# Patient Record
Sex: Female | Born: 1990 | Race: Black or African American | Hispanic: No | Marital: Single | Smoking: Never smoker
Health system: Southern US, Community
[De-identification: ages and names within clinical notes are randomized; demographics above are authoritative.]

## PROBLEM LIST (undated history)

## (undated) DIAGNOSIS — D573 Sickle-cell trait: Secondary | ICD-10-CM

## (undated) DIAGNOSIS — O24419 Gestational diabetes mellitus in pregnancy, unspecified control: Secondary | ICD-10-CM

## (undated) HISTORY — PX: CHOLECYSTECTOMY: SHX55

## (undated) HISTORY — DX: Gestational diabetes mellitus in pregnancy, unspecified control: O24.419

## (undated) HISTORY — DX: Sickle-cell trait: D57.3

---
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
## 2010-05-13 LAB — COMPREHENSIVE METABOLIC PANEL
ALT: 210 U/L — ABNORMAL HIGH (ref 0–35)
Alkaline Phosphatase: 130 U/L — ABNORMAL HIGH (ref 39–117)
CO2: 27 mEq/L (ref 19–32)
Calcium: 9 mg/dL (ref 8.4–10.5)
Chloride: 105 mEq/L (ref 96–112)
GFR calc non Af Amer: >60 mL/min (ref >60)
Glucose, Bld: 115 mg/dL — ABNORMAL HIGH (ref 70–99)
Potassium: 3.8 mEq/L (ref 3.5–5.1)
Sodium: 139 mEq/L (ref 135–145)
Total Bilirubin: 1.3 mg/dL — ABNORMAL HIGH (ref 0.3–1.2)

## 2010-05-13 LAB — URINALYSIS, ROUTINE W REFLEX MICROSCOPIC
Hgb urine dipstick: NEGATIVE
Protein, ur: NEGATIVE mg/dL
Urobilinogen, UA: 1 mg/dL (ref 0.0–1.0)

## 2010-05-13 LAB — DIFFERENTIAL
Basophils Absolute: 0 10*3/uL (ref 0.0–0.1)
Basophils Relative: 0 % (ref 0–1)
Eosinophils Absolute: 0.1 10*3/uL (ref 0.0–0.7)
Neutrophils Relative %: 65 % (ref 43–77)

## 2010-05-13 LAB — POCT PREGNANCY, URINE: Preg Test, Ur: NEGATIVE

## 2010-05-13 LAB — URINE MICROSCOPIC-ADD ON

## 2010-05-13 LAB — CBC
HCT: 34.9 % — ABNORMAL LOW (ref 36.0–46.0)
Hemoglobin: 11.6 g/dL — ABNORMAL LOW (ref 12.0–15.0)
MCHC: 33.2 g/dL (ref 30.0–36.0)

## 2010-05-13 LAB — LIPASE, BLOOD: Lipase: 33 U/L (ref 11–59)

## 2010-06-03 LAB — CBC
Hemoglobin: 10.5 g/dL — ABNORMAL LOW (ref 12.0–15.0)
Platelets: 279 10*3/uL (ref 150–400)
RBC: 3.67 MIL/uL — ABNORMAL LOW (ref 3.87–5.11)
RDW: 14.6 % (ref 11.5–15.5)
RDW: 14.6 % (ref 11.5–15.5)
WBC: 11.3 10*3/uL — ABNORMAL HIGH (ref 4.0–10.5)

## 2010-06-03 LAB — RPR: RPR Ser Ql: NONREACTIVE

## 2010-06-08 LAB — URINALYSIS, ROUTINE W REFLEX MICROSCOPIC
Glucose, UA: NEGATIVE mg/dL
Hgb urine dipstick: NEGATIVE
Specific Gravity, Urine: 1.017 (ref 1.005–1.030)
Urobilinogen, UA: 1 mg/dL (ref 0.0–1.0)

## 2010-06-08 LAB — CBC
MCHC: 34.1 g/dL (ref 30.0–36.0)
MCV: 81 fL (ref 78.0–100.0)
Platelets: 326 10*3/uL (ref 150–400)
RBC: 3.88 MIL/uL (ref 3.87–5.11)

## 2010-06-08 LAB — COMPREHENSIVE METABOLIC PANEL
Alkaline Phosphatase: 78 U/L (ref 39–117)
BUN: 4 mg/dL — ABNORMAL LOW (ref 6–23)
Chloride: 107 mEq/L (ref 96–112)
GFR calc Af Amer: >60 mL/min (ref >60)
Glucose, Bld: 89 mg/dL (ref 70–99)
Sodium: 138 mEq/L (ref 135–145)
Total Bilirubin: 0.4 mg/dL (ref 0.3–1.2)

## 2010-06-08 LAB — URINE MICROSCOPIC-ADD ON

## 2010-06-08 LAB — DIFFERENTIAL
Basophils Absolute: 0 10*3/uL (ref 0.0–0.1)
Basophils Relative: 0 % (ref 0–1)
Neutro Abs: 10 10*3/uL — ABNORMAL HIGH (ref 1.7–7.7)
Neutrophils Relative %: 79 % — ABNORMAL HIGH (ref 43–77)

## 2010-06-09 LAB — DIFFERENTIAL
Eosinophils Absolute: 0.1 10*3/uL (ref 0.0–0.7)
Eosinophils Relative: 1 % (ref 0–5)
Lymphs Abs: 2.1 10*3/uL (ref 0.7–4.0)

## 2010-06-09 LAB — COMPREHENSIVE METABOLIC PANEL
ALT: 18 U/L (ref 0–35)
AST: 16 U/L (ref 0–37)
CO2: 23 mEq/L (ref 19–32)
Calcium: 9.6 mg/dL (ref 8.4–10.5)
Chloride: 106 mEq/L (ref 96–112)
GFR calc Af Amer: >60 mL/min (ref >60)
GFR calc non Af Amer: >60 mL/min (ref >60)
Sodium: 136 mEq/L (ref 135–145)
Total Bilirubin: 0.2 mg/dL — ABNORMAL LOW (ref 0.3–1.2)

## 2010-06-09 LAB — CBC
RBC: 3.93 MIL/uL (ref 3.87–5.11)
WBC: 11.1 10*3/uL — ABNORMAL HIGH (ref 4.0–10.5)

## 2010-06-09 LAB — POCT PREGNANCY, URINE: Preg Test, Ur: POSITIVE

## 2010-06-09 LAB — HCG, QUANTITATIVE, PREGNANCY: hCG, Beta Chain, Quant, S: 16981 m[IU]/mL — ABNORMAL HIGH (ref <5)

## 2010-06-09 LAB — WET PREP, GENITAL: Trich, Wet Prep: NONE SEEN

## 2010-06-09 LAB — RPR: RPR Ser Ql: NONREACTIVE

## 2010-06-09 LAB — URINALYSIS, ROUTINE W REFLEX MICROSCOPIC
Ketones, ur: NEGATIVE mg/dL
Nitrite: NEGATIVE
Protein, ur: NEGATIVE mg/dL

## 2010-12-10 LAB — STREP A DNA PROBE: Group A Strep Probe: NEGATIVE

## 2013-10-01 ENCOUNTER — Encounter: Payer: Self-pay | Admitting: Women's Health

## 2013-10-01 ENCOUNTER — Encounter: Payer: Self-pay | Admitting: *Deleted

## 2013-10-01 ENCOUNTER — Encounter: Payer: Self-pay | Admitting: Obstetrics & Gynecology

## 2014-10-14 ENCOUNTER — Other Ambulatory Visit: Payer: Self-pay | Admitting: Obstetrics & Gynecology

## 2014-10-14 DIAGNOSIS — O3680X0 Pregnancy with inconclusive fetal viability, not applicable or unspecified: Secondary | ICD-10-CM

## 2014-10-20 ENCOUNTER — Other Ambulatory Visit: Payer: Self-pay

## 2014-10-24 ENCOUNTER — Ambulatory Visit (INDEPENDENT_AMBULATORY_CARE_PROVIDER_SITE_OTHER): Payer: Medicaid Other

## 2014-10-24 DIAGNOSIS — O3680X0 Pregnancy with inconclusive fetal viability, not applicable or unspecified: Secondary | ICD-10-CM | POA: Diagnosis not present

## 2014-10-24 NOTE — Progress Notes (Signed)
[place] 7+6wks,single IUP w/ys,pos fht 165bpm,crl 15.66mm,EDD 06/06/2015

## 2014-10-28 ENCOUNTER — Encounter: Payer: Medicaid Other | Admitting: Adult Health

## 2014-11-19 ENCOUNTER — Encounter: Payer: Medicaid Other | Admitting: Women's Health

## 2014-12-11 ENCOUNTER — Encounter: Payer: Self-pay | Admitting: Advanced Practice Midwife

## 2014-12-11 ENCOUNTER — Ambulatory Visit (INDEPENDENT_AMBULATORY_CARE_PROVIDER_SITE_OTHER): Payer: Medicaid Other | Admitting: Advanced Practice Midwife

## 2014-12-11 VITALS — BP 120/76 | HR 80 | Ht 64.0 in | Wt 207.0 lb

## 2014-12-11 DIAGNOSIS — Z3492 Encounter for supervision of normal pregnancy, unspecified, second trimester: Secondary | ICD-10-CM | POA: Diagnosis not present

## 2014-12-11 DIAGNOSIS — Z1389 Encounter for screening for other disorder: Secondary | ICD-10-CM

## 2014-12-11 DIAGNOSIS — Z369 Encounter for antenatal screening, unspecified: Secondary | ICD-10-CM

## 2014-12-11 DIAGNOSIS — Z23 Encounter for immunization: Secondary | ICD-10-CM

## 2014-12-11 DIAGNOSIS — Z331 Pregnant state, incidental: Secondary | ICD-10-CM

## 2014-12-11 DIAGNOSIS — Z3490 Encounter for supervision of normal pregnancy, unspecified, unspecified trimester: Secondary | ICD-10-CM | POA: Insufficient documentation

## 2014-12-11 DIAGNOSIS — Z363 Encounter for antenatal screening for malformations: Secondary | ICD-10-CM

## 2014-12-11 LAB — POCT URINALYSIS DIPSTICK
Blood, UA: NEGATIVE
Glucose, UA: NEGATIVE
KETONES UA: NEGATIVE
Leukocytes, UA: NEGATIVE
Nitrite, UA: NEGATIVE
PROTEIN UA: NEGATIVE

## 2014-12-11 NOTE — Patient Instructions (Signed)
Safe Medications in Pregnancy   Acne: Benzoyl Peroxide Salicylic Acid  Backache/Headache: Tylenol: 2 regular strength every 4 hours OR              2 Extra strength every 6 hours  Colds/Coughs/Allergies: Benadryl (alcohol free) 25 mg every 6 hours as needed Breath right strips Claritin Cepacol throat lozenges Chloraseptic throat spray Cold-Eeze- up to three times per day Cough drops, alcohol free Flonase (by prescription only) Guaifenesin Mucinex Robitussin DM (plain only, alcohol free) Saline nasal spray/drops Sudafed (pseudoephedrine) & Actifed ** use only after [redacted] weeks gestation and if you do not have high blood pressure Tylenol Vicks Vaporub Zinc lozenges Zyrtec   Constipation: Colace Ducolax suppositories Fleet enema Glycerin suppositories Metamucil Milk of magnesia Miralax Senokot Smooth move tea  Diarrhea: Kaopectate Imodium A-D  *NO pepto Bismol  Hemorrhoids: Anusol Anusol HC Preparation H Tucks  Indigestion: Tums Maalox Mylanta Zantac  Pepcid  Insomnia: Benadryl (alcohol free) 25mg every 6 hours as needed Tylenol PM Unisom, no Gelcaps  Leg Cramps: Tums MagGel  Nausea/Vomiting:  Bonine Dramamine Emetrol Ginger extract Sea bands Meclizine  Nausea medication to take during pregnancy:  Unisom (doxylamine succinate 25 mg tablets) Take one tablet daily at bedtime. If symptoms are not adequately controlled, the dose can be increased to a maximum recommended dose of two tablets daily (1/2 tablet in the morning, 1/2 tablet mid-afternoon and one at bedtime). Vitamin B6 100mg tablets. Take one tablet twice a day (up to 200 mg per day).  Skin Rashes: Aveeno products Benadryl cream or 25mg every 6 hours as needed Calamine Lotion 1% cortisone cream  Yeast infection: Gyne-lotrimin 7 Monistat 7   **If taking multiple medications, please check labels to avoid duplicating the same active ingredients **take medication as directed on  the label ** Do not exceed 4000 mg of tylenol in 24 hours **Do not take medications that contain aspirin or ibuprofen       Second Trimester of Pregnancy The second trimester is from week 13 through week 28, months 4 through 6. The second trimester is often a time when you feel your best. Your body has also adjusted to being pregnant, and you begin to feel better physically. Usually, morning sickness has lessened or quit completely, you may have more energy, and you may have an increase in appetite. The second trimester is also a time when the fetus is growing rapidly. At the end of the sixth month, the fetus is about 9 inches long and weighs about 1 pounds. You will likely begin to feel the baby move (quickening) between 18 and 20 weeks of the pregnancy. BODY CHANGES Your body goes through many changes during pregnancy. The changes vary from woman to woman.   Your weight will continue to increase. You will notice your lower abdomen bulging out.  You may begin to get stretch marks on your hips, abdomen, and breasts.  You may develop headaches that can be relieved by medicines approved by your health care provider.  You may urinate more often because the fetus is pressing on your bladder.  You may develop or continue to have heartburn as a result of your pregnancy.  You may develop constipation because certain hormones are causing the muscles that push waste through your intestines to slow down.  You may develop hemorrhoids or swollen, bulging veins (varicose veins).  You may have back pain because of the weight gain and pregnancy hormones relaxing your joints between the bones in your pelvis and as   a result of a shift in weight and the muscles that support your balance.  Your breasts will continue to grow and be tender.  Your gums may bleed and may be sensitive to brushing and flossing.  Dark spots or blotches (chloasma, mask of pregnancy) may develop on your face. This will likely  fade after the baby is born.  A dark line from your belly button to the pubic area (linea nigra) may appear. This will likely fade after the baby is born.  You may have changes in your hair. These can include thickening of your hair, rapid growth, and changes in texture. Some women also have hair loss during or after pregnancy, or hair that feels dry or thin. Your hair will most likely return to normal after your baby is born. WHAT TO EXPECT AT YOUR PRENATAL VISITS During a routine prenatal visit:  You will be weighed to make sure you and the fetus are growing normally.  Your blood pressure will be taken.  Your abdomen will be measured to track your baby's growth.  The fetal heartbeat will be listened to.  Any test results from the previous visit will be discussed. Your health care provider may ask you:  How you are feeling.  If you are feeling the baby move.  If you have had any abnormal symptoms, such as leaking fluid, bleeding, severe headaches, or abdominal cramping.  If you are using any tobacco products, including cigarettes, chewing tobacco, and electronic cigarettes.  If you have any questions. Other tests that may be performed during your second trimester include:  Blood tests that check for:  Low iron levels (anemia).  Gestational diabetes (between 24 and 28 weeks).  Rh antibodies.  Urine tests to check for infections, diabetes, or protein in the urine.  An ultrasound to confirm the proper growth and development of the baby.  An amniocentesis to check for possible genetic problems.  Fetal screens for spina bifida and Down syndrome.  HIV (human immunodeficiency virus) testing. Routine prenatal testing includes screening for HIV, unless you choose not to have this test. HOME CARE INSTRUCTIONS   Avoid all smoking, herbs, alcohol, and unprescribed drugs. These chemicals affect the formation and growth of the baby.  Do not use any tobacco products, including  cigarettes, chewing tobacco, and electronic cigarettes. If you need help quitting, ask your health care provider. You may receive counseling support and other resources to help you quit.  Follow your health care provider's instructions regarding medicine use. There are medicines that are either safe or unsafe to take during pregnancy.  Exercise only as directed by your health care provider. Experiencing uterine cramps is a good sign to stop exercising.  Continue to eat regular, healthy meals.  Wear a good support bra for breast tenderness.  Do not use hot tubs, steam rooms, or saunas.  Wear your seat belt at all times when driving.  Avoid raw meat, uncooked cheese, cat litter boxes, and soil used by cats. These carry germs that can cause birth defects in the baby.  Take your prenatal vitamins.  Take 1500-2000 mg of calcium daily starting at the 20th week of pregnancy until you deliver your baby.  Try taking a stool softener (if your health care provider approves) if you develop constipation. Eat more high-fiber foods, such as fresh vegetables or fruit and whole grains. Drink plenty of fluids to keep your urine clear or pale yellow.  Take warm sitz baths to soothe any pain or discomfort caused by   hemorrhoids. Use hemorrhoid cream if your health care provider approves.  If you develop varicose veins, wear support hose. Elevate your feet for 15 minutes, 3-4 times a day. Limit salt in your diet.  Avoid heavy lifting, wear low heel shoes, and practice good posture.  Rest with your legs elevated if you have leg cramps or low back pain.  Visit your dentist if you have not gone yet during your pregnancy. Use a soft toothbrush to brush your teeth and be gentle when you floss.  A sexual relationship may be continued unless your health care provider directs you otherwise.  Continue to go to all your prenatal visits as directed by your health care provider. SEEK MEDICAL CARE IF:   You have  dizziness.  You have mild pelvic cramps, pelvic pressure, or nagging pain in the abdominal area.  You have persistent nausea, vomiting, or diarrhea.  You have a bad smelling vaginal discharge.  You have pain with urination. SEEK IMMEDIATE MEDICAL CARE IF:   You have a fever.  You are leaking fluid from your vagina.  You have spotting or bleeding from your vagina.  You have severe abdominal cramping or pain.  You have rapid weight gain or loss.  You have shortness of breath with chest pain.  You notice sudden or extreme swelling of your face, hands, ankles, feet, or legs.  You have not felt your baby move in over an hour.  You have severe headaches that do not go away with medicine.  You have vision changes.   This information is not intended to replace advice given to you by your health care provider. Make sure you discuss any questions you have with your health care provider.   Document Released: 02/08/2001 Document Revised: 03/07/2014 Document Reviewed: 04/17/2012 Elsevier Interactive Patient Education 2016 [employer]  

## 2014-12-11 NOTE — Progress Notes (Signed)
  Subjective:    Sandra Olson is a [id number] 5817w5d being seen today for her first obstetrical visit.  Her obstetrical history is significant for GHTN (no preeclampsia) during labor with last pregnancy.  Pregnancy history fully reviewed.  Patient reports no complaints.  Filed Vitals:   12/11/14 0909 12/11/14 0918  BP: 120/76   Pulse: 80   Height:  5\' 4"  (1.626 m)  Weight: 207 lb (93.895 kg)     HISTORY: OB History  Gravida Para Term Preterm AB SAB TAB Ectopic Multiple Living  3 2 2       2     # Outcome Date GA Lbr Len/2nd Weight Sex Delivery Anes PTL Lv  3 Current           2 Term 05/13/13 939w0d  5 lb 4 oz (2.381 kg) F Vag-Spont   Y  1 Term 12/13/08 7529w0d   M Vag-Spont   Y     History reviewed. No pertinent past medical history. Past Surgical History  Procedure Laterality Date  . Cholecystectomy     Family History  Problem Relation Age of Onset  . Hypertension Mother   . Hypertension Father   . Diabetes Father   . Sickle cell trait Daughter   . Sickle cell trait Son      Exam                                      System:     Skin: normal coloration and turgor, no rashes    Neurologic: oriented, normal, normal mood   Extremities: normal strength, tone, and muscle mass   HEENT PERRLA   Mouth/Teeth mucous membranes moist, normal dentition   Neck supple and no masses   Cardiovascular: regular rate and rhythm   Respiratory:  appears well, vitals normal, no respiratory distress, acyanotic   Abdomen: soft, non-tender;  FHR: 150          Assessment:    Pregnancy: [id number] Patient Active Problem List   Diagnosis Date Noted  . Supervision of normal pregnancy 12/11/2014        Plan:     Initial labs drawn. Continue prenatal vitamins  Problem list reviewed and updated  Reviewed n/v relief measures and warning s/s to report  Reviewed recommended weight gain based on pre-gravid BMI  Encouraged well-balanced diet Genetic Screening discussed Quad  Screen: requested.  Ultrasound discussed; fetal survey: requested.  Follow up in 4.5-5 weeks for LROB/anatomy scan.  CRESENZO-DISHMAN,Marwah Disbro 12/11/2014

## 2014-12-11 NOTE — Progress Notes (Signed)
Pt states that she had a little spotting, pt is concerned because she has not felt the baby move and it took her a little while to get in for an appointment. Pt given CCNC form and lab consents to read over and sign.

## 2014-12-12 LAB — GC/CHLAMYDIA PROBE AMP
CHLAMYDIA, DNA PROBE: NEGATIVE
Neisseria gonorrhoeae by PCR: NEGATIVE

## 2014-12-13 LAB — URINE CULTURE: Organism ID, Bacteria: NO GROWTH

## 2014-12-18 LAB — CBC
Hematocrit: 36.5 % (ref 34.0–46.6)
Hemoglobin: 12 g/dL (ref 11.1–15.9)
MCH: 25.6 pg — AB (ref 26.6–33.0)
MCHC: 32.9 g/dL (ref 31.5–35.7)
MCV: 78 fL — ABNORMAL LOW (ref 79–97)
Platelets: 340 10*3/uL (ref 150–379)
RBC: 4.69 x10E6/uL (ref 3.77–5.28)
RDW: 15 % (ref 12.3–15.4)
WBC: 10.9 10*3/uL — ABNORMAL HIGH (ref 3.4–10.8)

## 2014-12-18 LAB — RUBELLA SCREEN: RUBELLA: 3.84 {index} (ref >0.99)

## 2014-12-18 LAB — URINALYSIS, ROUTINE W REFLEX MICROSCOPIC
Bilirubin, UA: NEGATIVE
Glucose, UA: NEGATIVE
KETONES UA: NEGATIVE
NITRITE UA: NEGATIVE
Protein, UA: NEGATIVE
RBC UA: NEGATIVE
SPEC GRAV UA: 1.017 (ref 1.005–1.030)
UUROB: 0.2 mg/dL (ref 0.2–1.0)
pH, UA: 6 (ref 5.0–7.5)

## 2014-12-18 LAB — VARICELLA ZOSTER ANTIBODY, IGG: VARICELLA: 1992 {index} (ref >165)

## 2014-12-18 LAB — ABO/RH: RH TYPE: POSITIVE

## 2014-12-18 LAB — CYSTIC FIBROSIS MUTATION 97: GENE DIS ANAL CARRIER INTERP BLD/T-IMP: NOT DETECTED

## 2014-12-18 LAB — RPR: RPR: NONREACTIVE

## 2014-12-18 LAB — PMP SCREEN PROFILE (10S), URINE
Amphetamine Screen, Ur: NEGATIVE ng/mL
BARBITURATE SCRN UR: NEGATIVE ng/mL
BENZODIAZEPINE SCREEN, URINE: NEGATIVE ng/mL
CANNABINOIDS UR QL SCN: NEGATIVE ng/mL
CREATININE(CRT), U: 144 mg/dL (ref 20.0–300.0)
Cocaine(Metab.)Screen, Urine: NEGATIVE ng/mL
Methadone Scn, Ur: NEGATIVE ng/mL
OPIATE SCRN UR: NEGATIVE ng/mL
Oxycodone+Oxymorphone Ur Ql Scn: NEGATIVE ng/mL
PCP Scrn, Ur: NEGATIVE ng/mL
PROPOXYPHENE SCREEN: NEGATIVE ng/mL
Ph of Urine: 5.5 (ref 4.5–8.9)

## 2014-12-18 LAB — MICROSCOPIC EXAMINATION: Casts: NONE SEEN /lpf

## 2014-12-18 LAB — ANTIBODY SCREEN: ANTIBODY SCREEN: NEGATIVE

## 2014-12-18 LAB — HEPATITIS B SURFACE ANTIGEN: HEP B S AG: NEGATIVE

## 2014-12-18 LAB — HIV ANTIBODY (ROUTINE TESTING W REFLEX): HIV SCREEN 4TH GENERATION: NONREACTIVE

## 2015-01-08 ENCOUNTER — Encounter: Payer: Medicaid Other | Admitting: Advanced Practice Midwife

## 2015-01-08 ENCOUNTER — Other Ambulatory Visit: Payer: Medicaid Other

## 2015-01-08 ENCOUNTER — Encounter: Payer: Self-pay | Admitting: *Deleted

## 2015-01-13 ENCOUNTER — Encounter: Payer: Medicaid Other | Admitting: Advanced Practice Midwife

## 2015-01-14 ENCOUNTER — Encounter: Payer: Medicaid Other | Admitting: Women's Health

## 2015-01-14 ENCOUNTER — Encounter: Payer: Self-pay | Admitting: Women's Health

## 2015-01-14 ENCOUNTER — Ambulatory Visit (INDEPENDENT_AMBULATORY_CARE_PROVIDER_SITE_OTHER): Payer: Medicaid Other | Admitting: Women's Health

## 2015-01-14 VITALS — BP 120/60 | HR 68 | Wt 209.0 lb

## 2015-01-14 DIAGNOSIS — Z3492 Encounter for supervision of normal pregnancy, unspecified, second trimester: Secondary | ICD-10-CM

## 2015-01-14 DIAGNOSIS — D573 Sickle-cell trait: Secondary | ICD-10-CM

## 2015-01-14 DIAGNOSIS — Z8759 Personal history of other complications of pregnancy, childbirth and the puerperium: Secondary | ICD-10-CM

## 2015-01-14 DIAGNOSIS — Z331 Pregnant state, incidental: Secondary | ICD-10-CM

## 2015-01-14 DIAGNOSIS — Z1389 Encounter for screening for other disorder: Secondary | ICD-10-CM

## 2015-01-14 DIAGNOSIS — Z363 Encounter for antenatal screening for malformations: Secondary | ICD-10-CM

## 2015-01-14 LAB — POCT URINALYSIS DIPSTICK
Blood, UA: NEGATIVE
Glucose, UA: NEGATIVE
Nitrite, UA: NEGATIVE
PROTEIN UA: NEGATIVE

## 2015-01-14 NOTE — Progress Notes (Signed)
Low-risk OB appointment [id number] 1156w4d Estimated Date of Delivery: 06/06/15 BP 120/60 mmHg  Pulse 68  Wt 209 lb (94.802 kg)  LMP 08/16/2014 (Exact Date)  BP, weight, and urine reviewed.  Refer to obstetrical flow sheet for FH & FHR.  Reports good fm.  Denies regular uc's, lof, vb, or uti s/s. No complaints. Missed appt for anatomy u/s- when rescheduled u/s wasn't rescheduled. Reports she is [place] trait +, FOB has never been tested- recommended he be tested.  Reviewed ptl s/s, fm. Plan:  Continue routine obstetrical care  F/U asap for anatomy u/s (no visit), then 4wks for OB appointment. She wants to do AFP at same time as u/s, not today

## 2015-01-14 NOTE — Patient Instructions (Signed)

## 2015-01-16 ENCOUNTER — Telehealth: Payer: Self-pay | Admitting: *Deleted

## 2015-01-16 ENCOUNTER — Ambulatory Visit (INDEPENDENT_AMBULATORY_CARE_PROVIDER_SITE_OTHER): Payer: Medicaid Other

## 2015-01-16 DIAGNOSIS — Z36 Encounter for antenatal screening of mother: Secondary | ICD-10-CM | POA: Diagnosis not present

## 2015-01-16 DIAGNOSIS — Z363 Encounter for antenatal screening for malformations: Secondary | ICD-10-CM

## 2015-01-16 MED ORDER — COMPLETENATE 29-1 MG PO CHEW: CHEWABLE_TABLET | ORAL | Status: AC

## 2015-01-16 NOTE — Telephone Encounter (Signed)
Pt informed PNV e-scribed.  

## 2015-01-16 NOTE — Progress Notes (Signed)
US 19+6wks,measurements c/w dates,cx 5 cm,normal ov's bilat,ant pl gr 0,fhr 145 bpm,efw 343g,variable pos,svp of fluid 4.3cm,bilat pyelectasis LK 6 mm,RK 4.374mm,anatomy complete

## 2015-01-19 ENCOUNTER — Encounter: Payer: Self-pay | Admitting: Obstetrics and Gynecology

## 2015-01-19 DIAGNOSIS — IMO0002 Reserved for concepts with insufficient information to code with codable children: Secondary | ICD-10-CM | POA: Insufficient documentation

## 2015-02-11 ENCOUNTER — Encounter: Payer: Medicaid Other | Admitting: Obstetrics and Gynecology

## 2015-02-16 ENCOUNTER — Other Ambulatory Visit: Payer: Self-pay | Admitting: Obstetrics and Gynecology

## 2015-02-16 ENCOUNTER — Other Ambulatory Visit: Payer: Medicaid Other

## 2015-02-16 ENCOUNTER — Encounter: Payer: Self-pay | Admitting: Obstetrics and Gynecology

## 2015-02-16 ENCOUNTER — Ambulatory Visit: Payer: Medicaid Other | Admitting: Obstetrics and Gynecology

## 2015-02-16 VITALS — BP 100/60 | HR 80 | Wt 211.3 lb

## 2015-02-16 DIAGNOSIS — O35EXX0 Maternal care for other (suspected) fetal abnormality and damage, fetal genitourinary anomalies, not applicable or unspecified: Secondary | ICD-10-CM

## 2015-02-16 DIAGNOSIS — O358XX0 Maternal care for other (suspected) fetal abnormality and damage, not applicable or unspecified: Secondary | ICD-10-CM

## 2015-02-16 LAB — POCT URINALYSIS DIPSTICK
Blood, UA: NEGATIVE
GLUCOSE UA: NEGATIVE
KETONES UA: NEGATIVE
LEUKOCYTES UA: NEGATIVE
Nitrite, UA: NEGATIVE
Protein, UA: NEGATIVE

## 2015-02-17 ENCOUNTER — Encounter: Payer: Medicaid Other | Admitting: Advanced Practice Midwife

## 2015-02-17 ENCOUNTER — Other Ambulatory Visit: Payer: Medicaid Other

## 2015-03-03 ENCOUNTER — Encounter: Payer: Self-pay | Admitting: Advanced Practice Midwife

## 2015-03-03 ENCOUNTER — Ambulatory Visit (INDEPENDENT_AMBULATORY_CARE_PROVIDER_SITE_OTHER): Payer: Medicaid Other

## 2015-03-03 ENCOUNTER — Ambulatory Visit (INDEPENDENT_AMBULATORY_CARE_PROVIDER_SITE_OTHER): Payer: Medicaid Other | Admitting: Advanced Practice Midwife

## 2015-03-03 VITALS — BP 112/74 | HR 82 | Wt 213.0 lb

## 2015-03-03 DIAGNOSIS — Z331 Pregnant state, incidental: Secondary | ICD-10-CM

## 2015-03-03 DIAGNOSIS — Z3493 Encounter for supervision of normal pregnancy, unspecified, third trimester: Secondary | ICD-10-CM

## 2015-03-03 DIAGNOSIS — O35EXX0 Maternal care for other (suspected) fetal abnormality and damage, fetal genitourinary anomalies, not applicable or unspecified: Secondary | ICD-10-CM

## 2015-03-03 DIAGNOSIS — O358XX0 Maternal care for other (suspected) fetal abnormality and damage, not applicable or unspecified: Secondary | ICD-10-CM | POA: Diagnosis not present

## 2015-03-03 DIAGNOSIS — Z1389 Encounter for screening for other disorder: Secondary | ICD-10-CM

## 2015-03-03 LAB — POCT URINALYSIS DIPSTICK
GLUCOSE UA: NEGATIVE
Ketones, UA: NEGATIVE
Leukocytes, UA: NEGATIVE
Protein, UA: NEGATIVE
RBC UA: NEGATIVE

## 2015-03-03 NOTE — Progress Notes (Signed)
Pt denies any problems or concerns at this time.  

## 2015-03-03 NOTE — Progress Notes (Signed)
[id number] 3245w3d Estimated Date of Delivery: 06/06/15  Last menstrual period 08/16/2014.   BP weight and urine results all reviewed and noted.  Please refer to the obstetrical flow sheet for the fundal height and fetal heart rate documentation:US to recheck mild pyelectasis:  US 26+3 wks,cephalic,cx 4.5cm,normal ov's bilat,ant pl gr 1,svp of fluid 7cm,fhr 144 bpm,rt renal pelvis 3.5 mm (wnl),lt renal pelvis 3.252mm (wnl),measurement c/w dates,efw 994g 57%  Patient reports good fetal movement, denies any bleeding and no rupture of membranes symptoms or regular contractions. Patient is without complaints. All questions were answered.  No orders of the defined types were placed in this encounter.    Plan:  Continued routine obstetrical care,   Return in about 2 weeks (around 03/17/2015) for PN2/LROB.

## 2015-03-03 NOTE — Patient Instructions (Signed)

## 2015-03-03 NOTE — Progress Notes (Signed)
US 26+3 wks,cephalic,cx 4.5cm,normal ov's bilat,ant pl gr 1,svp of fluid 7cm,fhr 144 bpm,rt renal pelvis 3.5 mm (wnl),lt renal pelvis 3.262mm (wnl),measurement c/w dates,efw 994g 57%

## 2015-03-17 ENCOUNTER — Other Ambulatory Visit: Payer: Medicaid Other

## 2015-03-17 ENCOUNTER — Encounter: Payer: Medicaid Other | Admitting: Women's Health

## 2015-03-24 ENCOUNTER — Other Ambulatory Visit: Payer: Medicaid Other

## 2015-03-24 ENCOUNTER — Encounter: Payer: Medicaid Other | Admitting: Women's Health

## 2015-04-15 ENCOUNTER — Encounter: Payer: Self-pay | Admitting: Obstetrics and Gynecology

## 2015-04-15 ENCOUNTER — Ambulatory Visit (INDEPENDENT_AMBULATORY_CARE_PROVIDER_SITE_OTHER): Payer: Medicaid Other | Admitting: Obstetrics and Gynecology

## 2015-04-15 VITALS — BP 120/80 | HR 91 | Wt 214.5 lb

## 2015-04-15 DIAGNOSIS — Z1389 Encounter for screening for other disorder: Secondary | ICD-10-CM

## 2015-04-15 DIAGNOSIS — Z3493 Encounter for supervision of normal pregnancy, unspecified, third trimester: Secondary | ICD-10-CM

## 2015-04-15 DIAGNOSIS — Z331 Pregnant state, incidental: Secondary | ICD-10-CM

## 2015-04-15 LAB — POCT URINALYSIS DIPSTICK
Blood, UA: NEGATIVE
Glucose, UA: NEGATIVE
KETONES UA: NEGATIVE
Leukocytes, UA: NEGATIVE
Nitrite, UA: NEGATIVE
PROTEIN UA: NEGATIVE

## 2015-04-15 NOTE — Progress Notes (Signed)
Pt denies any problems or concerns at this time.  

## 2015-04-15 NOTE — Progress Notes (Signed)
Patient ID: Sandra Olson, female   DOB: 06/16/1990, 25 y.o.   MRN: [id number]  [id number] [redacted]w[redacted]d Estimated Date of Delivery: 06/06/15  Blood pressure 120/80, pulse 91, weight 214 lb 8 oz (97.297 kg), last menstrual period 08/16/2014.   refer to the ob flow sheet for FH and FHR, also BP, Wt, Urine results:notable for negative   Patient reports  + good fetal movement, denies any bleeding and no rupture of membranes symptoms or regular contractions. Patient complaints: Patient states she plans to breastfeed. She reports concerns as she is currently breastfeeding her current child, and she has been experiencing uterine contractions that have become increasingly painful. She notes some vaginal discharge with mild spotting, but she is unsure whether this is different than usual.. Some spotting Patient reports she is interested in having an IUD for contraception following this pregnancy. Patient notes she has been traveling this pregnancy as her husband is a [employer] architect, and she follows him. However, she plans to stay locally more towards the end of the pregnancy, and she plans to raise her child locally as well.  FHR: 149 bpm FH: 34 cm  Questions were answered. Assessment: LROB G3P2002 @ [redacted]w[redacted]d    Patient currently breastfeeding, causing uterine contractions. Advised pt to discontinue breastfeeding at this time to prevent premature delivery.  Plan:  Continued routine obstetrical care,   F/u in 2 weeks for pnx care    By signing my name below, I, Ronney Lion, attest that this documentation has been prepared under the direction and in the presence of Tilda Burrow, MD. Electronically Signed: Ronney Lion, ED Scribe. 04/15/2015. 10:22 AM.  I personally performed the services described in this documentation, which was SCRIBED in my presence. The recorded information has been reviewed and considered accurate. It has been edited as necessary during review. Tilda Burrow, MD    b

## 2015-04-30 ENCOUNTER — Encounter: Payer: Self-pay | Admitting: Obstetrics and Gynecology

## 2015-04-30 ENCOUNTER — Ambulatory Visit: Payer: Medicaid Other | Admitting: Obstetrics and Gynecology

## 2015-08-29 ENCOUNTER — Encounter (HOSPITAL_COMMUNITY): Payer: Self-pay | Admitting: *Deleted

## 2017-02-28 NOTE — L&D Delivery Note (Signed)
Delivery Note Pt progressed rapidly after SROM at 2140 to complete at 2345. She had been an induction on the morning of 5/29 for GDMA2 with BPs noted to be elevated to severe range with mag sulfate started. She was induced with cervical foley, cytotec, and Pit.  At 11:56 PM a viable female was delivered via Vaginal, Spontaneous (Presentation: LOP).  APGAR: 6, 9; weight: pending.  Nuchal cord x 1 reduced after delivery; cord clamped and cut by FOB; hospital cord blood sample collected. Placenta status: spont , intact .  Cord: 3 vessel  Anesthesia:  Epidural Episiotomy: None Lacerations: None Est. Blood Loss (mL): 150  Mom to AICU.  Baby to Couplet care / Skin to Skin.  Cam Hai CNM 07/27/2017, 12:19 AM  Please schedule this patient for Postpartum visit in: 1 week for BP check with the following provider: Any provider, then 6wks for PP visit For C/S patients schedule nurse incision check in weeks 2 weeks: no High risk pregnancy complicated by: GDM; onset preeclampsia at admission Delivery mode:  SVD Anticipated Birth Control:  BTL done PP PP Procedures needed: BP check at 1wk; GTT at 6 wks Schedule Integrated BH visit: no

## 2017-03-27 ENCOUNTER — Encounter: Payer: Self-pay | Admitting: Adult Health

## 2017-03-27 ENCOUNTER — Other Ambulatory Visit: Payer: Self-pay

## 2017-03-27 ENCOUNTER — Ambulatory Visit (INDEPENDENT_AMBULATORY_CARE_PROVIDER_SITE_OTHER): Payer: Medicaid Other | Admitting: Adult Health

## 2017-03-27 VITALS — BP 122/64 | HR 98 | Ht 64.0 in | Wt 221.0 lb

## 2017-03-27 DIAGNOSIS — N926 Irregular menstruation, unspecified: Secondary | ICD-10-CM | POA: Diagnosis not present

## 2017-03-27 DIAGNOSIS — F32A Depression, unspecified: Secondary | ICD-10-CM

## 2017-03-27 DIAGNOSIS — F329 Major depressive disorder, single episode, unspecified: Secondary | ICD-10-CM

## 2017-03-27 DIAGNOSIS — Z3201 Encounter for pregnancy test, result positive: Secondary | ICD-10-CM | POA: Diagnosis not present

## 2017-03-27 DIAGNOSIS — O3680X0 Pregnancy with inconclusive fetal viability, not applicable or unspecified: Secondary | ICD-10-CM

## 2017-03-27 DIAGNOSIS — Z3A01 Less than 8 weeks gestation of pregnancy: Secondary | ICD-10-CM | POA: Insufficient documentation

## 2017-03-27 LAB — POCT URINE PREGNANCY: Preg Test, Ur: POSITIVE — AB

## 2017-03-27 NOTE — Patient Instructions (Signed)
First Trimester of Pregnancy The first trimester of pregnancy is from week 1 until the end of week 13 (months 1 through 3). A week after a sperm fertilizes an egg, the egg will implant on the wall of the uterus. This embryo will begin to develop into a baby. Genes from you and your partner will form the baby. The female genes will determine whether the baby will be a boy or a girl. At 6-8 weeks, the eyes and face will be formed, and the heartbeat can be seen on ultrasound. At the end of 12 weeks, all the baby's organs will be formed. Now that you are pregnant, you will want to do everything you can to have a healthy baby. Two of the most important things are to get good prenatal care and to follow your health care provider's instructions. Prenatal care is all the medical care you receive before the baby's birth. This care will help prevent, find, and treat any problems during the pregnancy and childbirth. Body changes during your first trimester Your body goes through many changes during pregnancy. The changes vary from woman to woman.  You may gain or lose a couple of pounds at first.  You may feel sick to your stomach (nauseous) and you may throw up (vomit). If the vomiting is uncontrollable, call your health care provider.  You may tire easily.  You may develop headaches that can be relieved by medicines. All medicines should be approved by your health care provider.  You may urinate more often. Painful urination may mean you have a bladder infection.  You may develop heartburn as a result of your pregnancy.  You may develop constipation because certain hormones are causing the muscles that push stool through your intestines to slow down.  You may develop hemorrhoids or swollen veins (varicose veins).  Your breasts may begin to grow larger and become tender. Your nipples may stick out more, and the tissue that surrounds them (areola) may become darker.  Your gums may bleed and may be  sensitive to brushing and flossing.  Dark spots or blotches (chloasma, mask of pregnancy) may develop on your face. This will likely fade after the baby is born.  Your menstrual periods will stop.  You may have a loss of appetite.  You may develop cravings for certain kinds of food.  You may have changes in your emotions from day to day, such as being excited to be pregnant or being concerned that something may go wrong with the pregnancy and baby.  You may have more vivid and strange dreams.  You may have changes in your hair. These can include thickening of your hair, rapid growth, and changes in texture. Some women also have hair loss during or after pregnancy, or hair that feels dry or thin. Your hair will most likely return to normal after your baby is born.  What to expect at prenatal visits During a routine prenatal visit:  You will be weighed to make sure you and the baby are growing normally.  Your blood pressure will be taken.  Your abdomen will be measured to track your baby's growth.  The fetal heartbeat will be listened to between weeks 10 and 14 of your pregnancy.  Test results from any previous visits will be discussed.  Your health care provider may ask you:  How you are feeling.  If you are feeling the baby move.  If you have had any abnormal symptoms, such as leaking fluid, bleeding, severe headaches,   or abdominal cramping.  If you are using any tobacco products, including cigarettes, chewing tobacco, and electronic cigarettes.  If you have any questions.  Other tests that may be performed during your first trimester include:  Blood tests to find your blood type and to check for the presence of any previous infections. The tests will also be used to check for low iron levels (anemia) and protein on red blood cells (Rh antibodies). Depending on your risk factors, or if you previously had diabetes during pregnancy, you may have tests to check for high blood  sugar that affects pregnant women (gestational diabetes).  Urine tests to check for infections, diabetes, or protein in the urine.  An ultrasound to confirm the proper growth and development of the baby.  Fetal screens for spinal cord problems (spina bifida) and Down syndrome.  HIV (human immunodeficiency virus) testing. Routine prenatal testing includes screening for HIV, unless you choose not to have this test.  You may need other tests to make sure you and the baby are doing well.  Follow these instructions at home: Medicines  Follow your health care provider's instructions regarding medicine use. Specific medicines may be either safe or unsafe to take during pregnancy.  Take a prenatal vitamin that contains at least 600 micrograms (mcg) of folic acid.  If you develop constipation, try taking a stool softener if your health care provider approves. Eating and drinking  Eat a balanced diet that includes fresh fruits and vegetables, whole grains, good sources of protein such as meat, eggs, or tofu, and low-fat dairy. Your health care provider will help you determine the amount of weight gain that is right for you.  Avoid raw meat and uncooked cheese. These carry germs that can cause birth defects in the baby.  Eating four or five small meals rather than three large meals a day may help relieve nausea and vomiting. If you start to feel nauseous, eating a few soda crackers can be helpful. Drinking liquids between meals, instead of during meals, also seems to help ease nausea and vomiting.  Limit foods that are high in fat and processed sugars, such as fried and sweet foods.  To prevent constipation: ? Eat foods that are high in fiber, such as fresh fruits and vegetables, whole grains, and beans. ? Drink enough fluid to keep your urine clear or pale yellow. Activity  Exercise only as directed by your health care provider. Most women can continue their usual exercise routine during  pregnancy. Try to exercise for 30 minutes at least 5 days a week. Exercising will help you: ? Control your weight. ? Stay in shape. ? Be prepared for labor and delivery.  Experiencing pain or cramping in the lower abdomen or lower back is a good sign that you should stop exercising. Check with your health care provider before continuing with normal exercises.  Try to avoid standing for long periods of time. Move your legs often if you must stand in one place for a long time.  Avoid heavy lifting.  Wear low-heeled shoes and practice good posture.  You may continue to have sex unless your health care provider tells you not to. Relieving pain and discomfort  Wear a good support bra to relieve breast tenderness.  Take warm sitz baths to soothe any pain or discomfort caused by hemorrhoids. Use hemorrhoid cream if your health care provider approves.  Rest with your legs elevated if you have leg cramps or low back pain.  If you develop   varicose veins in your legs, wear support hose. Elevate your feet for 15 minutes, 3-4 times a day. Limit salt in your diet. Prenatal care  Schedule your prenatal visits by the twelfth week of pregnancy. They are usually scheduled monthly at first, then more often in the last 2 months before delivery.  Write down your questions. Take them to your prenatal visits.  Keep all your prenatal visits as told by your health care provider. This is important. Safety  Wear your seat belt at all times when driving.  Make a list of emergency phone numbers, including numbers for family, friends, the hospital, and police and fire departments. General instructions  Ask your health care provider for a referral to a local prenatal education class. Begin classes no later than the beginning of month 6 of your pregnancy.  Ask for help if you have counseling or nutritional needs during pregnancy. Your health care provider can offer advice or refer you to specialists for help  with various needs.  Do not use hot tubs, steam rooms, or saunas.  Do not douche or use tampons or scented sanitary pads.  Do not cross your legs for long periods of time.  Avoid cat litter boxes and soil used by cats. These carry germs that can cause birth defects in the baby and possibly loss of the fetus by miscarriage or stillbirth.  Avoid all smoking, herbs, alcohol, and medicines not prescribed by your health care provider. Chemicals in these products affect the formation and growth of the baby.  Do not use any products that contain nicotine or tobacco, such as cigarettes and e-cigarettes. If you need help quitting, ask your health care provider. You may receive counseling support and other resources to help you quit.  Schedule a dentist appointment. At home, brush your teeth with a soft toothbrush and be gentle when you floss. Contact a health care provider if:  You have dizziness.  You have mild pelvic cramps, pelvic pressure, or nagging pain in the abdominal area.  You have persistent nausea, vomiting, or diarrhea.  You have a bad smelling vaginal discharge.  You have pain when you urinate.  You notice increased swelling in your face, hands, legs, or ankles.  You are exposed to fifth disease or chickenpox.  You are exposed to German measles (rubella) and have never had it. Get help right away if:  You have a fever.  You are leaking fluid from your vagina.  You have spotting or bleeding from your vagina.  You have severe abdominal cramping or pain.  You have rapid weight gain or loss.  You vomit blood or material that looks like coffee grounds.  You develop a severe headache.  You have shortness of breath.  You have any kind of trauma, such as from a fall or a car accident. Summary  The first trimester of pregnancy is from week 1 until the end of week 13 (months 1 through 3).  Your body goes through many changes during pregnancy. The changes vary from  woman to woman.  You will have routine prenatal visits. During those visits, your health care provider will examine you, discuss any test results you may have, and talk with you about how you are feeling. This information is not intended to replace advice given to you by your health care provider. Make sure you discuss any questions you have with your health care provider. Document Released: 02/08/2001 Document Revised: 01/27/2016 Document Reviewed: 01/27/2016 Elsevier Interactive Patient Education  2018 [employer]  

## 2017-03-27 NOTE — Progress Notes (Signed)
Subjective:     Patient ID: Sandra Olson, female   DOB: 18-Jul-1990, 27 y.o.   MRN: [id number]  HPI Sandra Olson is a 27 year old black female in for UPT, has missed a period.  Review of Systems +missed period Reviewed past medical,surgical, social and family history. Reviewed medications and allergies.     Objective:   Physical Exam BP 122/64 (BP Location: Right Arm, Patient Position: Sitting, Cuff Size: Large)   Pulse 98   Ht 5\' 4"  (1.626 m)   Wt 221 lb (100.2 kg)   LMP 02/11/2017 (Approximate)   Breastfeeding? Yes   BMI 37.93 kg/m   UPT +, about 6+2 weeks by LMP with EDD 11/18/17.Skin warm and dry. Neck: mid line trachea, normal thyroid, good ROM, no lymphadenopathy noted. Lungs: clear to ausculation bilaterally. Cardiovascular: regular rate and rhythm. Abdomen is soft and non tender.PHQ 9 score 12, denies being suicidal and declines meds for now.     Assessment:     1. Positive pregnancy test   2. Less than [redacted] weeks gestation of pregnancy   3. Encounter to determine fetal viability of pregnancy, single or unspecified fetus   4. Depression, unspecified depression type       Plan:     Return in 1 week for dating US Review handout on First trimester and by [facility]  Continue PNV

## 2017-04-03 ENCOUNTER — Other Ambulatory Visit: Payer: Self-pay | Admitting: Adult Health

## 2017-04-03 ENCOUNTER — Ambulatory Visit (INDEPENDENT_AMBULATORY_CARE_PROVIDER_SITE_OTHER): Payer: Medicaid Other

## 2017-04-03 DIAGNOSIS — O3680X0 Pregnancy with inconclusive fetal viability, not applicable or unspecified: Secondary | ICD-10-CM | POA: Diagnosis not present

## 2017-04-03 DIAGNOSIS — Z3A23 23 weeks gestation of pregnancy: Secondary | ICD-10-CM | POA: Diagnosis not present

## 2017-04-03 NOTE — Progress Notes (Signed)
US 23 wks,breech,anterior pl gr 0,normal ovaries bilat,cx 6.4 cm,svp of fluid 5.4 cm,efw 609 g,fhr 142 bpm,anatomy complete,no obvious abnormalities,EDD 07/31/2017 by today's ultrasound

## 2017-04-13 ENCOUNTER — Encounter: Payer: Self-pay | Admitting: Women's Health

## 2017-04-13 ENCOUNTER — Encounter: Payer: Medicaid Other | Admitting: Women's Health

## 2017-04-13 ENCOUNTER — Ambulatory Visit: Payer: Medicaid Other | Admitting: *Deleted

## 2017-05-08 ENCOUNTER — Ambulatory Visit (INDEPENDENT_AMBULATORY_CARE_PROVIDER_SITE_OTHER): Payer: Medicaid Other | Admitting: Women's Health

## 2017-05-08 ENCOUNTER — Encounter: Payer: Self-pay | Admitting: Women's Health

## 2017-05-08 ENCOUNTER — Ambulatory Visit: Payer: Medicaid Other | Admitting: *Deleted

## 2017-05-08 VITALS — BP 130/62 | Wt 226.0 lb

## 2017-05-08 DIAGNOSIS — Z3483 Encounter for supervision of other normal pregnancy, third trimester: Secondary | ICD-10-CM

## 2017-05-08 DIAGNOSIS — R102 Pelvic and perineal pain: Secondary | ICD-10-CM

## 2017-05-08 DIAGNOSIS — O26893 Other specified pregnancy related conditions, third trimester: Secondary | ICD-10-CM

## 2017-05-08 DIAGNOSIS — Z23 Encounter for immunization: Secondary | ICD-10-CM

## 2017-05-08 DIAGNOSIS — L299 Pruritus, unspecified: Secondary | ICD-10-CM | POA: Diagnosis not present

## 2017-05-08 DIAGNOSIS — Z331 Pregnant state, incidental: Secondary | ICD-10-CM

## 2017-05-08 DIAGNOSIS — O0933 Supervision of pregnancy with insufficient antenatal care, third trimester: Secondary | ICD-10-CM

## 2017-05-08 DIAGNOSIS — O0990 Supervision of high risk pregnancy, unspecified, unspecified trimester: Secondary | ICD-10-CM | POA: Insufficient documentation

## 2017-05-08 DIAGNOSIS — Z1389 Encounter for screening for other disorder: Secondary | ICD-10-CM

## 2017-05-08 DIAGNOSIS — Z3A28 28 weeks gestation of pregnancy: Secondary | ICD-10-CM

## 2017-05-08 LAB — POCT URINALYSIS DIPSTICK
Blood, UA: NEGATIVE
GLUCOSE UA: NEGATIVE
KETONES UA: NEGATIVE
Leukocytes, UA: NEGATIVE
Nitrite, UA: NEGATIVE
Protein, UA: NEGATIVE

## 2017-05-08 NOTE — Progress Notes (Signed)
INITIAL OBSTETRICAL VISIT Patient name: Sandra Olson MRN [id number]  Date of birth: 05/08/1990 Chief Complaint:   Initial Prenatal Visit (vaginal pain, itching everywhere-more at night)  History of Present Illness:   Sandra Olson is a 27 y.o. [id number] African American female at 5422w0d by 23wk u/s, with an Estimated Date of Delivery: 07/31/17 being seen today for her initial obstetrical visit.   Her obstetrical history is significant for term SVB x 3, had GHTN w/ one of the pregnancies, late pnc at 28wks-knew she was pregnant, just didn't know she was 'that far along'. [place] trait positive. Wants BTL.   Today she reports itching everywhere, worse at night. Pelvic bone pain.  Patient's last menstrual period was 02/11/2017 (approximate). Last pap 2016. Results were: normal Review of Systems:   Pertinent items are noted in HPI Denies cramping/contractions, leakage of fluid, vaginal bleeding, abnormal vaginal discharge w/ itching/odor/irritation, headaches, visual changes, shortness of breath, chest pain, abdominal pain, severe nausea/vomiting, or problems with urination or bowel movements unless otherwise stated above.  Pertinent History Reviewed:  Reviewed past medical,surgical, social, obstetrical and family history.  Reviewed problem list, medications and allergies. OB History  Gravida Para Term Preterm AB Living  4 3 3     3   SAB TAB Ectopic Multiple Live Births          3    # Outcome Date GA Lbr Len/2nd Weight Sex Delivery Anes PTL Lv  4 Current           3 Term 06/01/15 6059w2d  8 lb 14 oz (4.026 kg) M Vag-Spont EPI N LIV  2 Term 05/13/13 6844w0d  5 lb 4 oz (2.381 kg) F Vag-Spont None N LIV  1 Term 12/13/08 6222w0d  7 lb 15 oz (3.6 kg) M Vag-Spont EPI N LIV     Physical Assessment:   Vitals:   05/08/17 1537  BP: 130/62  Weight: 226 lb (102.5 kg)  Body mass index is 38.79 kg/m.       Physical Examination:  General appearance - well appearing, and in no distress  Mental  status - alert, oriented to person, place, and time  Psych:  She has a normal mood and affect  Skin - warm and dry, normal color, no suspicious lesions noted  Chest - effort normal, all lung fields clear to auscultation bilaterally  Heart - normal rate and regular rhythm  Abdomen - soft, nontender  Extremities:  No swelling or varicosities noted  Pelvic - VULVA: normal appearing vulva with no masses, tenderness or lesions  VAGINA: normal appearing vagina with normal color and discharge, no lesions  CERVIX: normal appearing cervix without discharge or lesions, no CMT  Thin prep pap is not done  Fetal Heart Rate (bpm): 134 via doppler FH: 29cm   Results for orders placed or performed in visit on 05/08/17 (from the past 24 hour(s))  POCT urinalysis dipstick   Collection Time: 05/08/17  4:01 PM  Result Value Ref Range   Color, UA     Clarity, UA     Glucose, UA neg    Bilirubin, UA     Ketones, UA neg    Spec Grav, UA  1.010 - 1.025   Blood, UA neg    pH, UA  5.0 - 8.0   Protein, UA neg    Urobilinogen, UA  0.2 or 1.0 E.U./dL   Nitrite, UA neg    Leukocytes, UA Negative Negative   Appearance  Odor      Assessment & Plan:  1) Low-Risk Pregnancy [id number] at [redacted]w[redacted]d with an Estimated Date of Delivery: 07/31/17   2) Initial OB visit  3) Late prenatal care @ 28wks  4) Generalized pruritus, worse at night> CMP added w/ today's labs, will get fasting bile acids when comes back for PN2. Discussed relief measures.   5) Symphysis pubis pain> discussed relief measures  6) Wants BTL> discussed high incidence of regret <30yo, risks/benefits, LARCs just as effective. Consent signed today  7) [place] trait +  Meds: No orders of the defined types were placed in this encounter.   Initial labs obtained Continue prenatal vitamins Reviewed n/v relief measures and warning s/s to report Reviewed recommended weight gain based on pre-gravid BMI Encouraged well-balanced diet Genetic Screening  discussed: too late Cystic fibrosis screening discussed declined Ultrasound discussed; fetal survey: results reviewed CCNC completed> [place], faxed Flu shot today  Follow-up: Return for asap for sugar test & bile acids (no visit), then 4wks for LROB, Sign BTL consent today.   Orders Placed This Encounter  Procedures  . GC/Chlamydia Probe Amp  . Urine Culture  . Flu Vaccine QUAD 36+ mos IM  . Obstetric Panel, Including HIV  . Urinalysis, Routine w reflex microscopic  . Pain Management Screening Profile (10S)  . Comprehensive metabolic panel  . POCT urinalysis dipstick    Cheral Marker CNM, [facility] 05/08/2017 4:49 PM

## 2017-05-08 NOTE — Patient Instructions (Addendum)
Sandra Olson, I greatly value your feedback.  If you receive a survey following your visit with [place] today, we appreciate you taking the time to fill it out.  Thanks, Joellyn Haff, CNM, WHNP-BC  You will have your sugar test next visit.  Please do not eat or drink anything after midnight the night before you come, not even water.  You will be here for at least two hours.      Call the office [phone]) or go to [facility] if:  You begin to have strong, frequent contractions  Your water breaks.  Sometimes it is a big gush of fluid, sometimes it is just a trickle that keeps getting your panties wet or running down your legs  You have vaginal bleeding.  It is normal to have a small amount of spotting if your cervix was checked.   You don't feel your baby moving like normal.  If you don't, get you something to eat and drink and lay down and focus on feeling your baby move.  You should feel at least 10 movements in 2 hours.  If you don't, you should call the office or go to [facility].    Tdap Vaccine  It is recommended that you get the Tdap vaccine during the third trimester of EACH pregnancy to help protect your baby from getting pertussis (whooping cough)  27-36 weeks is the BEST time to do this so that you can pass the protection on to your baby. During pregnancy is better than after pregnancy, but if you are unable to get it during pregnancy it will be offered at the hospital.   You can get this vaccine at the health department or your family doctor  Everyone who will be around your baby should also be up-to-date on their vaccines. Adults (who are not pregnant) only need 1 dose of Tdap during adulthood.   For your lower back pain you may:  Purchase a pregnancy belt from [employer], [employer], [employer], etc and wear it while you are up and about  Take warm baths  Use a heating pad to your lower back for no longer than 20 minutes at a time, and do not place near  abdomen  Take tylenol as needed. Please follow directions on the bottle  Kinesiology tape (can get from sporting goods store), google how to tape belly for pregnancy    Third Trimester of Pregnancy The third trimester is from week 29 through week 42, months 7 through 9. The third trimester is a time when the fetus is growing rapidly. At the end of the ninth month, the fetus is about 20 inches in length and weighs 6-10 pounds.  BODY CHANGES Your body goes through many changes during pregnancy. The changes vary from woman to woman.   Your weight will continue to increase. You can expect to gain 25-35 pounds (11-16 kg) by the end of the pregnancy.  You may begin to get stretch marks on your hips, abdomen, and breasts.  You may urinate more often because the fetus is moving lower into your pelvis and pressing on your bladder.  You may develop or continue to have heartburn as a result of your pregnancy.  You may develop constipation because certain hormones are causing the muscles that push waste through your intestines to slow down.  You may develop hemorrhoids or swollen, bulging veins (varicose veins).  You may have pelvic pain because of the weight gain and pregnancy hormones relaxing your joints between  the bones in your pelvis. Backaches may result from overexertion of the muscles supporting your posture.  You may have changes in your hair. These can include thickening of your hair, rapid growth, and changes in texture. Some women also have hair loss during or after pregnancy, or hair that feels dry or thin. Your hair will most likely return to normal after your baby is born.  Your breasts will continue to grow and be tender. A yellow discharge may leak from your breasts called colostrum.  Your belly button may stick out.  You may feel short of breath because of your expanding uterus.  You may notice the fetus "dropping," or moving lower in your abdomen.  You may have a bloody  mucus discharge. This usually occurs a few days to a week before labor begins.  Your cervix becomes thin and soft (effaced) near your due date. WHAT TO EXPECT AT YOUR PRENATAL EXAMS  You will have prenatal exams every 2 weeks until week 36. Then, you will have weekly prenatal exams. During a routine prenatal visit:  You will be weighed to make sure you and the fetus are growing normally.  Your blood pressure is taken.  Your abdomen will be measured to track your baby's growth.  The fetal heartbeat will be listened to.  Any test results from the previous visit will be discussed.  You may have a cervical check near your due date to see if you have effaced. At around 36 weeks, your caregiver will check your cervix. At the same time, your caregiver will also perform a test on the secretions of the vaginal tissue. This test is to determine if a type of bacteria, Group B streptococcus, is present. Your caregiver will explain this further. Your caregiver may ask you:  What your birth plan is.  How you are feeling.  If you are feeling the baby move.  If you have had any abnormal symptoms, such as leaking fluid, bleeding, severe headaches, or abdominal cramping.  If you have any questions. Other tests or screenings that may be performed during your third trimester include:  Blood tests that check for low iron levels (anemia).  Fetal testing to check the health, activity level, and growth of the fetus. Testing is done if you have certain medical conditions or if there are problems during the pregnancy. FALSE LABOR You may feel small, irregular contractions that eventually go away. These are called Braxton Hicks contractions, or false labor. Contractions may last for hours, days, or even weeks before true labor sets in. If contractions come at regular intervals, intensify, or become painful, it is best to be seen by your caregiver.  SIGNS OF LABOR   Menstrual-like cramps.  Contractions  that are 5 minutes apart or less.  Contractions that start on the top of the uterus and spread down to the lower abdomen and back.  A sense of increased pelvic pressure or back pain.  A watery or bloody mucus discharge that comes from the vagina. If you have any of these signs before the 37th week of pregnancy, call your caregiver right away. You need to go to the hospital to get checked immediately. HOME CARE INSTRUCTIONS   Avoid all smoking, herbs, alcohol, and unprescribed drugs. These chemicals affect the formation and growth of the baby.  Follow your caregiver's instructions regarding medicine use. There are medicines that are either safe or unsafe to take during pregnancy.  Exercise only as directed by your caregiver. Experiencing uterine cramps is  a good sign to stop exercising.  Continue to eat regular, healthy meals.  Wear a good support bra for breast tenderness.  Do not use hot tubs, steam rooms, or saunas.  Wear your seat belt at all times when driving.  Avoid raw meat, uncooked cheese, cat litter boxes, and soil used by cats. These carry germs that can cause birth defects in the baby.  Take your prenatal vitamins.  Try taking a stool softener (if your caregiver approves) if you develop constipation. Eat more high-fiber foods, such as fresh vegetables or fruit and whole grains. Drink plenty of fluids to keep your urine clear or pale yellow.  Take warm sitz baths to soothe any pain or discomfort caused by hemorrhoids. Use hemorrhoid cream if your caregiver approves.  If you develop varicose veins, wear support hose. Elevate your feet for 15 minutes, 3-4 times a day. Limit salt in your diet.  Avoid heavy lifting, wear low heal shoes, and practice good posture.  Rest a lot with your legs elevated if you have leg cramps or low back pain.  Visit your dentist if you have not gone during your pregnancy. Use a soft toothbrush to brush your teeth and be gentle when you  floss.  A sexual relationship may be continued unless your caregiver directs you otherwise.  Do not travel far distances unless it is absolutely necessary and only with the approval of your caregiver.  Take prenatal classes to understand, practice, and ask questions about the labor and delivery.  Make a trial run to the hospital.  Pack your hospital bag.  Prepare the baby's nursery.  Continue to go to all your prenatal visits as directed by your caregiver. SEEK MEDICAL CARE IF:  You are unsure if you are in labor or if your water has broken.  You have dizziness.  You have mild pelvic cramps, pelvic pressure, or nagging pain in your abdominal area.  You have persistent nausea, vomiting, or diarrhea.  You have a bad smelling vaginal discharge.  You have pain with urination. SEEK IMMEDIATE MEDICAL CARE IF:   You have a fever.  You are leaking fluid from your vagina.  You have spotting or bleeding from your vagina.  You have severe abdominal cramping or pain.  You have rapid weight loss or gain.  You have shortness of breath with chest pain.  You notice sudden or extreme swelling of your face, hands, ankles, feet, or legs.  You have not felt your baby move in over an hour.  You have severe headaches that do not go away with medicine.  You have vision changes. Document Released: 02/08/2001 Document Revised: 02/19/2013 Document Reviewed: 04/17/2012 [facility] Patient Information 2015 [place]. This information is not intended to replace advice given to you by your health care provider. Make sure you discuss any questions you have with your health care provider.

## 2017-05-09 LAB — URINALYSIS, ROUTINE W REFLEX MICROSCOPIC
Bilirubin, UA: NEGATIVE
Glucose, UA: NEGATIVE
Ketones, UA: NEGATIVE
Nitrite, UA: NEGATIVE
PH UA: 5.5 (ref 5.0–7.5)
Protein, UA: NEGATIVE
RBC, UA: NEGATIVE
Specific Gravity, UA: 1.019 (ref 1.005–1.030)
UUROB: 1 mg/dL (ref 0.2–1.0)

## 2017-05-09 LAB — OBSTETRIC PANEL, INCLUDING HIV
Antibody Screen: NEGATIVE
Basophils Absolute: 0 10*3/uL (ref 0.0–0.2)
Basos: 0 %
EOS (ABSOLUTE): 0.2 10*3/uL (ref 0.0–0.4)
EOS: 2 %
HEMOGLOBIN: 10.1 g/dL — AB (ref 11.1–15.9)
HEP B S AG: NEGATIVE
HIV Screen 4th Generation wRfx: NONREACTIVE
Hematocrit: 32.1 % — ABNORMAL LOW (ref 34.0–46.6)
IMMATURE GRANULOCYTES: 1 %
Immature Grans (Abs): 0.1 10*3/uL (ref 0.0–0.1)
Lymphocytes Absolute: 2.1 10*3/uL (ref 0.7–3.1)
Lymphs: 20 %
MCH: 24.1 pg — AB (ref 26.6–33.0)
MCHC: 31.5 g/dL (ref 31.5–35.7)
MCV: 77 fL — AB (ref 79–97)
MONOCYTES: 7 %
Monocytes Absolute: 0.8 10*3/uL (ref 0.1–0.9)
NEUTROS ABS: 7.6 10*3/uL — AB (ref 1.4–7.0)
NEUTROS PCT: 70 %
Platelets: 340 10*3/uL (ref 150–379)
RBC: 4.19 x10E6/uL (ref 3.77–5.28)
RDW: 15.6 % — ABNORMAL HIGH (ref 12.3–15.4)
RH TYPE: POSITIVE
RPR: NONREACTIVE
RUBELLA: 2.39 {index} (ref >0.99)
WBC: 10.9 10*3/uL — ABNORMAL HIGH (ref 3.4–10.8)

## 2017-05-09 LAB — COMPREHENSIVE METABOLIC PANEL
ALK PHOS: 97 IU/L (ref 39–117)
ALT: 10 IU/L (ref 0–32)
AST: 16 IU/L (ref 0–40)
Albumin/Globulin Ratio: 1.2 (ref 1.2–2.2)
Albumin: 3.6 g/dL (ref 3.5–5.5)
BUN/Creatinine Ratio: 6 — ABNORMAL LOW (ref 9–23)
BUN: 3 mg/dL — AB (ref 6–20)
Bilirubin Total: 0.3 mg/dL (ref 0.0–1.2)
CALCIUM: 9.9 mg/dL (ref 8.7–10.2)
CO2: 19 mmol/L — AB (ref 20–29)
CREATININE: 0.49 mg/dL — AB (ref 0.57–1.00)
Chloride: 104 mmol/L (ref 96–106)
GFR calc Af Amer: 156 mL/min/{1.73_m2} (ref >59)
GFR, EST NON AFRICAN AMERICAN: 135 mL/min/{1.73_m2} (ref >59)
GLUCOSE: 80 mg/dL (ref 65–99)
Globulin, Total: 3 g/dL (ref 1.5–4.5)
Potassium: 4 mmol/L (ref 3.5–5.2)
Sodium: 140 mmol/L (ref 134–144)
Total Protein: 6.6 g/dL (ref 6.0–8.5)

## 2017-05-09 LAB — PMP SCREEN PROFILE (10S), URINE
AMPHETAMINE SCREEN URINE: NEGATIVE ng/mL
BARBITURATE SCREEN URINE: NEGATIVE ng/mL
BENZODIAZEPINE SCREEN, URINE: NEGATIVE ng/mL
CANNABINOIDS UR QL SCN: NEGATIVE ng/mL
CREATININE(CRT), U: 148.4 mg/dL (ref 20.0–300.0)
Cocaine (Metab) Scrn, Ur: NEGATIVE ng/mL
METHADONE SCREEN, URINE: NEGATIVE ng/mL
OXYCODONE+OXYMORPHONE UR QL SCN: NEGATIVE ng/mL
Opiate Scrn, Ur: NEGATIVE ng/mL
PH UR, DRUG SCRN: 5.4 (ref 4.5–8.9)
PHENCYCLIDINE QUANTITATIVE URINE: NEGATIVE ng/mL
PROPOXYPHENE SCREEN URINE: NEGATIVE ng/mL

## 2017-05-09 LAB — MICROSCOPIC EXAMINATION: Casts: NONE SEEN /lpf

## 2017-05-10 LAB — GC/CHLAMYDIA PROBE AMP
Chlamydia trachomatis, NAA: NEGATIVE
Neisseria gonorrhoeae by PCR: NEGATIVE

## 2017-05-10 LAB — URINE CULTURE

## 2017-05-11 ENCOUNTER — Telehealth: Payer: Self-pay | Admitting: *Deleted

## 2017-05-11 ENCOUNTER — Other Ambulatory Visit: Payer: Self-pay | Admitting: Women's Health

## 2017-05-11 MED ORDER — FERROUS SULFATE 325 (65 FE) MG PO TABS: 325.0000 mg | ORAL_TABLET | Freq: Two times a day (BID) | ORAL | 3 refills | Status: DC

## 2017-05-11 NOTE — Telephone Encounter (Signed)
LMOVM that she is anemic so prescription for iron was sent to pharmacy.  Advised to take along with PNV and increase diet in iron rich foods such as red meat, green leafy vegetables, beans. Advised to call if she has any questions or problems.

## 2017-05-12 ENCOUNTER — Other Ambulatory Visit: Payer: Medicaid Other

## 2017-05-18 ENCOUNTER — Other Ambulatory Visit: Payer: Medicaid Other

## 2017-05-18 DIAGNOSIS — Z131 Encounter for screening for diabetes mellitus: Secondary | ICD-10-CM

## 2017-05-18 DIAGNOSIS — Z3A29 29 weeks gestation of pregnancy: Secondary | ICD-10-CM

## 2017-05-18 DIAGNOSIS — Z3483 Encounter for supervision of other normal pregnancy, third trimester: Secondary | ICD-10-CM

## 2017-05-18 DIAGNOSIS — L299 Pruritus, unspecified: Secondary | ICD-10-CM

## 2017-05-18 NOTE — Addendum Note (Signed)
Addended by: Moss McRESENZO, LATISHA M on: 05/18/2017 08:40 AM   Modules accepted: Orders

## 2017-05-19 ENCOUNTER — Telehealth: Payer: Self-pay | Admitting: *Deleted

## 2017-05-19 LAB — BILE ACIDS, TOTAL: BILE ACIDS TOTAL: 6.7 umol/L (ref 4.7–24.5)

## 2017-05-19 NOTE — Telephone Encounter (Signed)
Pt informed of bile acid level. DOB verified.

## 2017-06-06 ENCOUNTER — Encounter: Payer: Self-pay | Admitting: Obstetrics & Gynecology

## 2017-06-06 ENCOUNTER — Ambulatory Visit (INDEPENDENT_AMBULATORY_CARE_PROVIDER_SITE_OTHER): Payer: Medicaid Other | Admitting: Obstetrics & Gynecology

## 2017-06-06 VITALS — BP 130/70 | HR 97 | Wt 225.0 lb

## 2017-06-06 DIAGNOSIS — Z3483 Encounter for supervision of other normal pregnancy, third trimester: Secondary | ICD-10-CM

## 2017-06-06 DIAGNOSIS — Z1389 Encounter for screening for other disorder: Secondary | ICD-10-CM

## 2017-06-06 DIAGNOSIS — Z3A32 32 weeks gestation of pregnancy: Secondary | ICD-10-CM

## 2017-06-06 DIAGNOSIS — Z331 Pregnant state, incidental: Secondary | ICD-10-CM

## 2017-06-06 LAB — POCT URINALYSIS DIPSTICK
Blood, UA: NEGATIVE
GLUCOSE UA: NEGATIVE
Ketones, UA: NEGATIVE
LEUKOCYTES UA: NEGATIVE
NITRITE UA: NEGATIVE
Protein, UA: NEGATIVE

## 2017-06-06 NOTE — Progress Notes (Addendum)
[id number] 972w1d Estimated Date of Delivery: 07/31/17  Blood pressure 130/70, pulse 97, weight 225 lb (102.1 kg), last menstrual period 02/11/2017, currently breastfeeding.   BP weight and urine results all reviewed and noted.  Please refer to the obstetrical flow sheet for the fundal height and fetal heart rate documentation:  Patient reports good fetal movement, denies any bleeding and no rupture of membranes symptoms or regular contractions. Patient is without complaints. All questions were answered.  Orders Placed This Encounter  Procedures  . POCT urinalysis dipstick   FH U+12 Plan:  Continued routine obstetrical care, pt has not done her PN2 labs as of yet Will schedule for next week  Return in about 1 week (around 06/13/2017) for PN2, , LROB.

## 2017-06-13 ENCOUNTER — Other Ambulatory Visit: Payer: Medicaid Other

## 2017-06-13 ENCOUNTER — Encounter: Payer: Self-pay | Admitting: Women's Health

## 2017-06-13 ENCOUNTER — Ambulatory Visit (INDEPENDENT_AMBULATORY_CARE_PROVIDER_SITE_OTHER): Payer: Medicaid Other | Admitting: Women's Health

## 2017-06-13 VITALS — BP 130/68 | HR 87 | Wt 224.0 lb

## 2017-06-13 DIAGNOSIS — Z3A33 33 weeks gestation of pregnancy: Secondary | ICD-10-CM

## 2017-06-13 DIAGNOSIS — Z3483 Encounter for supervision of other normal pregnancy, third trimester: Secondary | ICD-10-CM

## 2017-06-13 DIAGNOSIS — Z1389 Encounter for screening for other disorder: Secondary | ICD-10-CM

## 2017-06-13 DIAGNOSIS — Z331 Pregnant state, incidental: Secondary | ICD-10-CM

## 2017-06-13 DIAGNOSIS — Z131 Encounter for screening for diabetes mellitus: Secondary | ICD-10-CM

## 2017-06-13 LAB — POCT URINALYSIS DIPSTICK
GLUCOSE UA: NEGATIVE
NITRITE UA: NEGATIVE
PROTEIN UA: NEGATIVE
RBC UA: NEGATIVE

## 2017-06-13 NOTE — Patient Instructions (Signed)
Sandra Olson, I greatly value your feedback.  If you receive a survey following your visit with us today, we appreciate you taking the time to fill it out.  Thanks, Joellyn HaffKim Norval Slaven, CNM, WHNP-BC   Call the office [phone]) or [facility] if:  You begin to have strong, frequent contractions  Your water breaks.  Sometimes it is a big gush of fluid, sometimes it is just a trickle that keeps getting your panties wet or running down your legs  You have vaginal bleeding.  It is normal to have a small amount of spotting if your cervix was checked.   You don't feel your baby moving like normal.  If you don't, get you something to eat and drink and lay down and focus on feeling your baby move.  You should feel at least 10 movements in 2 hours.  If you don't, you should call the office or [facility].    Tdap Vaccine  It is recommended that you get the Tdap vaccine during the third trimester of EACH pregnancy to help protect your baby from getting pertussis (whooping cough)  27-36 weeks is the BEST time to do this so that you can pass the protection on to your baby. During pregnancy is better than after pregnancy, but if you are unable to get it during pregnancy it will be offered at the hospital.   You can get this vaccine at the health department or your family doctor  Everyone who will be around your baby should also be up-to-date on their vaccines. Adults (who are not pregnant) only need 1 dose of Tdap during adulthood.   Third Trimester of Pregnancy The third trimester is from week 29 through week 42, months 7 through 9. The third trimester is a time when the fetus is growing rapidly. At the end of the ninth month, the fetus is about 20 inches in length and weighs 6-10 pounds.  BODY CHANGES Your body goes through many changes during pregnancy. The changes vary from woman to woman.   Your weight will continue to increase. You can expect to gain 25-35 pounds (11-16 kg) by  the end of the pregnancy.  You may begin to get stretch marks on your hips, abdomen, and breasts.  You may urinate more often because the fetus is moving lower into your pelvis and pressing on your bladder.  You may develop or continue to have heartburn as a result of your pregnancy.  You may develop constipation because certain hormones are causing the muscles that push waste through your intestines to slow down.  You may develop hemorrhoids or swollen, bulging veins (varicose veins).  You may have pelvic pain because of the weight gain and pregnancy hormones relaxing your joints between the bones in your pelvis. Backaches may result from overexertion of the muscles supporting your posture.  You may have changes in your hair. These can include thickening of your hair, rapid growth, and changes in texture. Some women also have hair loss during or after pregnancy, or hair that feels dry or thin. Your hair will most likely return to normal after your baby is born.  Your breasts will continue to grow and be tender. A yellow discharge may leak from your breasts called colostrum.  Your belly button may stick out.  You may feel short of breath because of your expanding uterus.  You may notice the fetus "dropping," or moving lower in your abdomen.  You may have a bloody  mucus discharge. This usually occurs a few days to a week before labor begins.  Your cervix becomes thin and soft (effaced) near your due date. WHAT TO EXPECT AT YOUR PRENATAL EXAMS  You will have prenatal exams every 2 weeks until week 36. Then, you will have weekly prenatal exams. During a routine prenatal visit:  You will be weighed to make sure you and the fetus are growing normally.  Your blood pressure is taken.  Your abdomen will be measured to track your baby's growth.  The fetal heartbeat will be listened to.  Any test results from the previous visit will be discussed.  You may have a cervical check near your  due date to see if you have effaced. At around 36 weeks, your caregiver will check your cervix. At the same time, your caregiver will also perform a test on the secretions of the vaginal tissue. This test is to determine if a type of bacteria, Group B streptococcus, is present. Your caregiver will explain this further. Your caregiver may ask you:  What your birth plan is.  How you are feeling.  If you are feeling the baby move.  If you have had any abnormal symptoms, such as leaking fluid, bleeding, severe headaches, or abdominal cramping.  If you have any questions. Other tests or screenings that may be performed during your third trimester include:  Blood tests that check for low iron levels (anemia).  Fetal testing to check the health, activity level, and growth of the fetus. Testing is done if you have certain medical conditions or if there are problems during the pregnancy. FALSE LABOR You may feel small, irregular contractions that eventually go away. These are called Braxton Hicks contractions, or false labor. Contractions may last for hours, days, or even weeks before true labor sets in. If contractions come at regular intervals, intensify, or become painful, it is best to be seen by your caregiver.  SIGNS OF LABOR   Menstrual-like cramps.  Contractions that are 5 minutes apart or less.  Contractions that start on the top of the uterus and spread down to the lower abdomen and back.  A sense of increased pelvic pressure or back pain.  A watery or bloody mucus discharge that comes from the vagina. If you have any of these signs before the 37th week of pregnancy, call your caregiver right away. You need to go to the hospital to get checked immediately. HOME CARE INSTRUCTIONS   Avoid all smoking, herbs, alcohol, and unprescribed drugs. These chemicals affect the formation and growth of the baby.  Follow your caregiver's instructions regarding medicine use. There are medicines  that are either safe or unsafe to take during pregnancy.  Exercise only as directed by your caregiver. Experiencing uterine cramps is a good sign to stop exercising.  Continue to eat regular, healthy meals.  Wear a good support bra for breast tenderness.  Do not use hot tubs, steam rooms, or saunas.  Wear your seat belt at all times when driving.  Avoid raw meat, uncooked cheese, cat litter boxes, and soil used by cats. These carry germs that can cause birth defects in the baby.  Take your prenatal vitamins.  Try taking a stool softener (if your caregiver approves) if you develop constipation. Eat more high-fiber foods, such as fresh vegetables or fruit and whole grains. Drink plenty of fluids to keep your urine clear or pale yellow.  Take warm sitz baths to soothe any pain or discomfort caused by hemorrhoids.  Use hemorrhoid cream if your caregiver approves.  If you develop varicose veins, wear support hose. Elevate your feet for 15 minutes, 3-4 times a day. Limit salt in your diet.  Avoid heavy lifting, wear low heal shoes, and practice good posture.  Rest a lot with your legs elevated if you have leg cramps or low back pain.  Visit your dentist if you have not gone during your pregnancy. Use a soft toothbrush to brush your teeth and be gentle when you floss.  A sexual relationship may be continued unless your caregiver directs you otherwise.  Do not travel far distances unless it is absolutely necessary and only with the approval of your caregiver.  Take prenatal classes to understand, practice, and ask questions about the labor and delivery.  Make a trial run to the hospital.  Pack your hospital bag.  Prepare the baby's nursery.  Continue to go to all your prenatal visits as directed by your caregiver. SEEK MEDICAL CARE IF:  You are unsure if you are in labor or if your water has broken.  You have dizziness.  You have mild pelvic cramps, pelvic pressure, or nagging  pain in your abdominal area.  You have persistent nausea, vomiting, or diarrhea.  You have a bad smelling vaginal discharge.  You have pain with urination. SEEK IMMEDIATE MEDICAL CARE IF:   You have a fever.  You are leaking fluid from your vagina.  You have spotting or bleeding from your vagina.  You have severe abdominal cramping or pain.  You have rapid weight loss or gain.  You have shortness of breath with chest pain.  You notice sudden or extreme swelling of your face, hands, ankles, feet, or legs.  You have not felt your baby move in over an hour.  You have severe headaches that do not go away with medicine.  You have vision changes. Document Released: 02/08/2001 Document Revised: 02/19/2013 Document Reviewed: 04/17/2012 [employer] Patient Information 2015 [employer]. This information is not intended to replace advice given to you by your health care provider. Make sure you discuss any questions you have with your health care provider.   

## 2017-06-13 NOTE — Progress Notes (Signed)
   LOW-RISK PREGNANCY VISIT Patient name: Sandra Olson MRN [id number]  Date of birth: 1990-04-04 Chief Complaint:   Routine Prenatal Visit (PN2 today)  History of Present Illness:   Sandra Olson is a 27 y.o. [phone] female at 7128w1d with an Estimated Date of Delivery: 07/31/17 being seen today for ongoing management of a low-risk pregnancy.  Today she reports no complaints. Contractions: Irregular. Vag. Bleeding: None.  Movement: Present. denies leaking of fluid. Review of Systems:   Pertinent items are noted in HPI Denies abnormal vaginal discharge w/ itching/odor/irritation, headaches, visual changes, shortness of breath, chest pain, abdominal pain, severe nausea/vomiting, or problems with urination or bowel movements unless otherwise stated above. Pertinent History Reviewed:  Reviewed past medical,surgical, social, obstetrical and family history.  Reviewed problem list, medications and allergies. Physical Assessment:   Vitals:   06/13/17 0943  BP: 130/68  Pulse: 87  Weight: 224 lb (101.6 kg)  Body mass index is 38.45 kg/m.        Physical Examination:   General appearance: Well appearing, and in no distress  Mental status: Alert, oriented to person, place, and time  Skin: Warm & dry  Cardiovascular: Normal heart rate noted  Respiratory: Normal respiratory effort, no distress  Abdomen: Soft, gravid, nontender  Pelvic: Cervical exam deferred         Extremities: Edema: Trace  Fetal Status: Fetal Heart Rate (bpm): 150 Fundal Height: 33 cm Movement: Present    Results for orders placed or performed in visit on 06/13/17 (from the past 24 hour(s))  POCT urinalysis dipstick   Collection Time: 06/13/17  9:44 AM  Result Value Ref Range   Color, UA     Clarity, UA     Glucose, UA neg    Bilirubin, UA     Ketones, UA 1+    Spec Grav, UA  1.010 - 1.025   Blood, UA neg    pH, UA  5.0 - 8.0   Protein, UA neg    Urobilinogen, UA  0.2 or 1.0 E.U./dL   Nitrite, UA neg    Leukocytes, UA Trace (A) Negative   Appearance     Odor      Assessment & Plan:  1) Low-risk pregnancy G4P3003 at 728w1d with an Estimated Date of Delivery: 07/31/17    Meds: No orders of the defined types were placed in this encounter.  Labs/procedures today: pn2  Plan:  Continue routine obstetrical care   Reviewed: Preterm labor symptoms and general obstetric precautions including but not limited to vaginal bleeding, contractions, leaking of fluid and fetal movement were reviewed in detail with the patient. Recommended Tdap at HD/PCP per CDC guidelines.   All questions were answered  Follow-up: Return in about 2 weeks (around 06/27/2017) for LROB.  Orders Placed This Encounter  Procedures  . POCT urinalysis dipstick   Cheral MarkerKimberly R Booker CNM, [facility]NP-BC 06/13/2017 10:13 AM

## 2017-06-14 ENCOUNTER — Telehealth: Payer: Self-pay | Admitting: *Deleted

## 2017-06-14 ENCOUNTER — Encounter: Payer: Self-pay | Admitting: Women's Health

## 2017-06-14 ENCOUNTER — Encounter: Payer: Self-pay | Admitting: *Deleted

## 2017-06-14 ENCOUNTER — Other Ambulatory Visit: Payer: Self-pay | Admitting: Women's Health

## 2017-06-14 DIAGNOSIS — O24419 Gestational diabetes mellitus in pregnancy, unspecified control: Secondary | ICD-10-CM | POA: Insufficient documentation

## 2017-06-14 DIAGNOSIS — O24410 Gestational diabetes mellitus in pregnancy, diet controlled: Secondary | ICD-10-CM

## 2017-06-14 LAB — GLUCOSE TOLERANCE, 2 HOURS W/ 1HR
GLUCOSE, 1 HOUR: 187 mg/dL — AB (ref 65–179)
Glucose, 2 hour: 150 mg/dL (ref 65–152)
Glucose, Fasting: 95 mg/dL — ABNORMAL HIGH (ref 65–91)

## 2017-06-14 NOTE — Telephone Encounter (Signed)
Attempted to call patient and emergency contact but neither number is working.  Will send letter.

## 2017-06-21 ENCOUNTER — Telehealth: Payer: Self-pay | Admitting: Obstetrics & Gynecology

## 2017-06-21 NOTE — Telephone Encounter (Signed)
Spoke to patient and informed her she did not pass her glucose test.  A referral was sent to the dietician but she may need to call them since her phone has been off.  Number given.  Informed she needs to check her BS 4 times daily and bring log to all appointments.  Advised to call today since she is already 34 weeks.  Patient verbalized understanding and stated she would.

## 2017-06-27 ENCOUNTER — Ambulatory Visit (INDEPENDENT_AMBULATORY_CARE_PROVIDER_SITE_OTHER): Payer: Medicaid Other | Admitting: Women's Health

## 2017-06-27 ENCOUNTER — Encounter: Payer: Self-pay | Admitting: Women's Health

## 2017-06-27 ENCOUNTER — Other Ambulatory Visit: Payer: Self-pay

## 2017-06-27 VITALS — BP 118/64 | HR 95 | Wt 225.0 lb

## 2017-06-27 DIAGNOSIS — Z3A35 35 weeks gestation of pregnancy: Secondary | ICD-10-CM

## 2017-06-27 DIAGNOSIS — O24410 Gestational diabetes mellitus in pregnancy, diet controlled: Secondary | ICD-10-CM

## 2017-06-27 DIAGNOSIS — Z331 Pregnant state, incidental: Secondary | ICD-10-CM

## 2017-06-27 DIAGNOSIS — Z1389 Encounter for screening for other disorder: Secondary | ICD-10-CM

## 2017-06-27 DIAGNOSIS — O0993 Supervision of high risk pregnancy, unspecified, third trimester: Secondary | ICD-10-CM

## 2017-06-27 LAB — POCT URINALYSIS DIPSTICK
GLUCOSE UA: NEGATIVE
KETONES UA: NEGATIVE
Leukocytes, UA: NEGATIVE
Nitrite, UA: NEGATIVE
Protein, UA: NEGATIVE
RBC UA: NEGATIVE

## 2017-06-27 NOTE — Progress Notes (Signed)
   HIGH-RISK PREGNANCY VISIT Patient name: Sandra Olson MRN [id number]  Date of birth: Apr 22, 1990 Chief Complaint:   High Risk Gestation  History of Present Illness:   Sandra Olson is a 27 y.o. G52P3003 female at [redacted]w[redacted]d with an Estimated Date of Delivery: 07/31/17 being seen today for ongoing management of a high-risk pregnancy complicated by A1DM dx @ 33wks, goes to dietician tomorrow, hasn't started checking sugars.  Today she reports no complaints. Contractions: Irregular. Vag. Bleeding: None.  Movement: Present. denies leaking of fluid.  Review of Systems:   Pertinent items are noted in HPI Denies abnormal vaginal discharge w/ itching/odor/irritation, headaches, visual changes, shortness of breath, chest pain, abdominal pain, severe nausea/vomiting, or problems with urination or bowel movements unless otherwise stated above. Pertinent History Reviewed:  Reviewed past medical,surgical, social, obstetrical and family history.  Reviewed problem list, medications and allergies. Physical Assessment:   Vitals:   06/27/17 1337  BP: 118/64  Pulse: 95  Weight: 225 lb (102.1 kg)  Body mass index is 38.62 kg/m.           Physical Examination:   General appearance: alert, well appearing, and in no distress  Mental status: alert, oriented to person, place, and time  Skin: warm & dry   Extremities: Edema: None    Cardiovascular: normal heart rate noted  Respiratory: normal respiratory effort, no distress  Abdomen: gravid, soft, non-tender  Pelvic: Cervical exam deferred         Fetal Status: Fetal Heart Rate (bpm): 135 Fundal Height: 36 cm Movement: Present    Fetal Surveillance Testing today: doppler   Results for orders placed or performed in visit on 06/27/17 (from the past 24 hour(s))  POCT urinalysis dipstick   Collection Time: 06/27/17  1:39 PM  Result Value Ref Range   Color, UA     Clarity, UA     Glucose, UA neg    Bilirubin, UA     Ketones, UA neg    Spec Grav, UA   1.010 - 1.025   Blood, UA neg    pH, UA  5.0 - 8.0   Protein, UA neg    Urobilinogen, UA  0.2 or 1.0 E.U./dL   Nitrite, UA neg    Leukocytes, UA Negative Negative   Appearance     Odor      Assessment & Plan:  1) High-risk pregnancy G4P3003 at [redacted]w[redacted]d with an Estimated Date of Delivery: 07/31/17   2) A1DM, dx @ 33wks, going to dietician tomorrow. Check sugars QID and bring log. Discussed importance of strict glycemic control and adherence to low carb diet during pregnancy as well as potential complications from uncontrolled diabetes during pregnancy.  Meds: No orders of the defined types were placed in this encounter.   Labs/procedures today: none  Treatment Plan:  Growth u/s @ 36-38wks     Deliver @ 40wks  Reviewed: Preterm labor symptoms and general obstetric precautions including but not limited to vaginal bleeding, contractions, leaking of fluid and fetal movement were reviewed in detail with the patient.  All questions were answered.  Follow-up: Return in about 1 week (around 07/04/2017) for HROB, US:EFW.  Orders Placed This Encounter  Procedures  . US OB Follow Up  . POCT urinalysis dipstick   Cheral Marker CNM, [facility] 06/27/2017 1:51 PM

## 2017-06-27 NOTE — Patient Instructions (Addendum)
Sheryn Bison, I greatly value your feedback.  If you receive a survey following your visit with [place] today, we appreciate you taking the time to fill it out.  Thanks, Joellyn Haff, CNM, WHNP-BC   Call the office [phone]) or go to [facility] if:  You begin to have strong, frequent contractions  Your water breaks.  Sometimes it is a big gush of fluid, sometimes it is just a trickle that keeps getting your panties wet or running down your legs  You have vaginal bleeding.  It is normal to have a small amount of spotting if your cervix was checked.   You don't feel your baby moving like normal.  If you don't, get you something to eat and drink and lay down and focus on feeling your baby move.  You should feel at least 10 movements in 2 hours.  If you don't, you should call the office or go to [facility].    Gestational Diabetes Mellitus, Diagnosis Gestational diabetes (gestational diabetes mellitus) is a short-term (temporary) form of diabetes that can happen during pregnancy. It goes away after you give birth. It may be caused by one or both of these problems:  Your body does not make enough of a hormone called insulin.  Your body does not respond in a normal way to insulin that it makes.  Insulin lets sugars (glucose) go into cells in the body. This gives you energy. If you have diabetes, sugars cannot get into cells. This causes high blood sugar (hyperglycemia). If diabetes is treated, it may not hurt you or your baby. Your doctor will set treatment goals for you. In general, you should have these blood sugar levels:  After not eating for a long time (fasting): 95 mg/dL (5.3 mmol/L).  After meals (postprandial): ? One hour after a meal: at or below 140 mg/dL (7.8 mmol/L). ? Two hours after a meal: at or below 120 mg/dL (6.7 mmol/L).  A1c (hemoglobin A1c) level: 6-6.5%.  Follow these instructions at home: Questions to Ask Your Doctor  You may want to ask these  questions:  Do I need to meet with a diabetes educator?  Where can I find a support group for people with diabetes?  What equipment will I need to care for myself at home?  What diabetes medicines do I need? When should I take them?  How often do I need to check my blood sugar?  What number can I call if I have questions?  When is my next doctor's visit?  General instructions  Take over-the-counter and prescription medicines only as told by your doctor.  Stay at a healthy weight during pregnancy.  Keep all follow-up visits as told by your doctor. This is important. Contact a doctor if:  Your blood sugar is at or above 240 mg/dL (45.4 mmol/L).  Your blood sugar is at or above 200 mg/dL (09.8 mmol/L) and you have ketones in your pee (urine).  You have been sick or have had a fever for 2 days or more and you are not getting better.  You have any of these problems for more than 6 hours: ? You cannot eat or drink. ? You feel sick to your stomach (nauseous). ? You throw up (vomit). ? You have watery poop (diarrhea). Get help right away if:  Your blood sugar is lower than 54 mg/dL (3 mmol/L).  You get confused.  You have trouble: ? Thinking clearly. ? Breathing.  Your baby moves less than normal.  You have: ? Moderate or large ketone levels in your pee (urine). ? Bleeding from your vagina. ? Unusual fluid coming from your vagina. ? Early contractions. These may feel like tightness in your belly. This information is not intended to replace advice given to you by your health care provider. Make sure you discuss any questions you have with your health care provider. Document Released: 06/08/2015 Document Revised: 07/23/2015 Document Reviewed: 03/20/2015 Elsevier Interactive Patient Education  [employer].

## 2017-06-28 ENCOUNTER — Encounter: Payer: Medicaid Other | Attending: Advanced Practice Midwife | Admitting: Registered"

## 2017-06-28 DIAGNOSIS — Z713 Dietary counseling and surveillance: Secondary | ICD-10-CM | POA: Diagnosis not present

## 2017-06-28 DIAGNOSIS — O24410 Gestational diabetes mellitus in pregnancy, diet controlled: Secondary | ICD-10-CM | POA: Insufficient documentation

## 2017-06-28 DIAGNOSIS — Z3A00 Weeks of gestation of pregnancy not specified: Secondary | ICD-10-CM | POA: Diagnosis not present

## 2017-06-28 DIAGNOSIS — O99810 Abnormal glucose complicating pregnancy: Secondary | ICD-10-CM

## 2017-06-30 ENCOUNTER — Encounter: Payer: Self-pay | Admitting: Registered"

## 2017-06-30 DIAGNOSIS — O99810 Abnormal glucose complicating pregnancy: Secondary | ICD-10-CM | POA: Insufficient documentation

## 2017-06-30 NOTE — Progress Notes (Signed)
Patient was seen on 06/28/17 for Gestational Diabetes self-management class at [facility]. The following learning objectives were met by the patient during this course:   States the definition of Gestational Diabetes  States why dietary management is important in controlling blood glucose  Describes the effects each nutrient has on blood glucose levels  Demonstrates ability to create a balanced meal plan  Demonstrates carbohydrate counting   States when to check blood glucose levels  Demonstrates proper blood glucose monitoring techniques  States the effect of stress and exercise on blood glucose levels  States the importance of limiting caffeine and abstaining from alcohol and smoking  Blood glucose monitor given: Accu-Chek Guide Lot # [id number] Exp: 06/03/2018 Blood glucose reading: 83  Patient instructed to monitor glucose levels: FBS: 60 - <95; 1 hour: <140; 2 hour: <120  Patient received handouts:  Nutrition Diabetes and Pregnancy, including carb counting list  Patient will be seen for follow-up as needed.

## 2017-07-04 ENCOUNTER — Other Ambulatory Visit: Payer: Self-pay

## 2017-07-04 ENCOUNTER — Ambulatory Visit (INDEPENDENT_AMBULATORY_CARE_PROVIDER_SITE_OTHER): Payer: Medicaid Other | Admitting: Advanced Practice Midwife

## 2017-07-04 ENCOUNTER — Encounter: Payer: Self-pay | Admitting: Advanced Practice Midwife

## 2017-07-04 ENCOUNTER — Ambulatory Visit (INDEPENDENT_AMBULATORY_CARE_PROVIDER_SITE_OTHER): Payer: Medicaid Other

## 2017-07-04 VITALS — BP 138/82 | HR 95 | Wt 224.0 lb

## 2017-07-04 DIAGNOSIS — O0990 Supervision of high risk pregnancy, unspecified, unspecified trimester: Secondary | ICD-10-CM

## 2017-07-04 DIAGNOSIS — O24410 Gestational diabetes mellitus in pregnancy, diet controlled: Secondary | ICD-10-CM

## 2017-07-04 DIAGNOSIS — Z3A36 36 weeks gestation of pregnancy: Secondary | ICD-10-CM

## 2017-07-04 DIAGNOSIS — Z1389 Encounter for screening for other disorder: Secondary | ICD-10-CM

## 2017-07-04 DIAGNOSIS — O0993 Supervision of high risk pregnancy, unspecified, third trimester: Secondary | ICD-10-CM

## 2017-07-04 DIAGNOSIS — Z331 Pregnant state, incidental: Secondary | ICD-10-CM

## 2017-07-04 DIAGNOSIS — O0933 Supervision of pregnancy with insufficient antenatal care, third trimester: Secondary | ICD-10-CM

## 2017-07-04 LAB — POCT URINALYSIS DIPSTICK
Blood, UA: NEGATIVE
Glucose, UA: NEGATIVE
Ketones, UA: NEGATIVE
LEUKOCYTES UA: NEGATIVE
NITRITE UA: NEGATIVE
PROTEIN UA: NEGATIVE

## 2017-07-04 MED ORDER — METFORMIN HCL 1000 MG PO TABS: 1000.0000 mg | ORAL_TABLET | Freq: Two times a day (BID) | ORAL | 1 refills | Status: DC

## 2017-07-04 NOTE — Progress Notes (Signed)
HIGH-RISK PREGNANCY VISIT Patient name: Sandra Olson MRN [id number]  Date of birth: 02-26-1991 Chief Complaint:   High Risk Gestation (u/s today)  History of Present Illness:   Sandra Olson is a 27 y.o. [phone] female at [redacted]w[redacted]d with an Estimated Date of Delivery: 07/31/17 being seen today for ongoing management of a high-risk pregnancy complicated by gestational DM. Just went to dietician 5/1.  FBS 130's every morning  and 2hr pp 130-183 this week Today she reports no complaints. Contractions: Irregular. Vag. Bleeding: None.  Movement: Present. denies leaking of fluid.  Review of Systems:   Pertinent items are noted in HPI Denies abnormal vaginal discharge w/ itching/odor/irritation, headaches, visual changes, shortness of breath, chest pain, abdominal pain, severe nausea/vomiting, or problems with urination or bowel movements unless otherwise stated above.    Pertinent History Reviewed:  Medical & Surgical Hx:   Past Medical History:  Diagnosis Date  . Gestational diabetes   . Medical history non-contributory   . Sickle cell trait [facility])    Past Surgical History:  Procedure Laterality Date  . CHOLECYSTECTOMY     Family History  Problem Relation Age of Onset  . Hypertension Mother   . Diabetes Mother   . Hypertension Father   . Diabetes Father   . Sickle cell trait Daughter   . Sickle cell trait Son   . Hypertension Paternal Grandfather   . Diabetes Paternal Grandfather   . Breast cancer Paternal Grandmother   . Hypertension Maternal Grandmother   . Diabetes Maternal Grandmother   . Sickle cell trait Maternal Grandmother   . Hypertension Paternal Uncle     Current Outpatient Medications:  .  diphenhydrAMINE (BENADRYL) 25 MG tablet, Take 25 mg by mouth every 6 (six) hours as needed., Disp: , Rfl:  .  ferrous sulfate 325 (65 FE) MG tablet, Take 1 tablet (325 mg total) by mouth 2 (two) times daily with a meal. (Patient taking differently: Take 325 mg by mouth daily. ),  Disp: 60 tablet, Rfl: 3 .  prenatal vitamin w/FE, FA (NATACHEW) 29-1 MG CHEW chewable tablet, Take 1 tablet po daily, Disp: 30 tablet, Rfl: 11 .  metFORMIN (GLUCOPHAGE) 1000 MG tablet, Take 1 tablet (1,000 mg total) by mouth 2 (two) times daily with a meal., Disp: 60 tablet, Rfl: 1 Social History: Reviewed -  reports that she has never smoked. She has never used smokeless tobacco.   Physical Assessment:   Vitals:   07/04/17 1435  BP: 138/82  Pulse: 95  Weight: 224 lb (101.6 kg)  Body mass index is 38.45 kg/m.           Physical Examination:   General appearance: alert, well appearing, and in no distress  Mental status: alert, oriented to person, place, and time  Skin: warm & dry   Extremities: Edema: None    Cardiovascular: normal heart rate noted  Respiratory: normal respiratory effort, no distress  Abdomen: gravid, soft, non-tender  Pelvic: Cervical exam deferred         Fetal Status:     Movement: Present    Fetal Surveillance Testing today: [place] 36+1 wks,cephalic,cx 5.6 cm,anterior pl gr 3,afi 19 cm,fhr 129 bpm,EFW 3475 g 95%,AC 99%    Results for orders placed or performed in visit on 07/04/17 (from the past 24 hour(s))  POCT urinalysis dipstick   Collection Time: 07/04/17  2:37 PM  Result Value Ref Range   Color, UA     Clarity, UA     Glucose,  UA neg    Bilirubin, UA     Ketones, UA neg    Spec Grav, UA  1.010 - 1.025   Blood, UA neg    pH, UA  5.0 - 8.0   Protein, UA neg    Urobilinogen, UA  0.2 or 1.0 E.U./dL   Nitrite, UA neg    Leukocytes, UA Negative Negative   Appearance     Odor      Assessment & Plan:  1) High-risk pregnancy G4P3003 at [redacted]w[redacted]d with an Estimated Date of Delivery: 07/31/17   2) GDM, unstable  3) baby 95%,   Labs/procedures today:   Medications: start metformin 1gm BID; if low, may 1/2 either pm or am dose.   Treatment Plan:  Weekly BPP, IOL 39 weeks  Follow-up: Return in about 1 week (around 07/11/2017) for HROB, US:BPP for next 2  weeks.  Orders Placed This Encounter  Procedures  . US FETAL BPP WO NON STRESS  . POCT urinalysis dipstick   Jacklyn Shell CNM 07/04/2017 3:12 PM

## 2017-07-04 NOTE — Progress Notes (Signed)
[place] 36+1 wks,cephalic,cx 5.6 cm,anterior pl gr 3,afi 19 cm,fhr 129 bpm,EFW 3475 g 95%,AC 99%

## 2017-07-13 ENCOUNTER — Ambulatory Visit (INDEPENDENT_AMBULATORY_CARE_PROVIDER_SITE_OTHER): Payer: Medicaid Other | Admitting: Obstetrics & Gynecology

## 2017-07-13 ENCOUNTER — Ambulatory Visit (INDEPENDENT_AMBULATORY_CARE_PROVIDER_SITE_OTHER): Payer: Medicaid Other

## 2017-07-13 ENCOUNTER — Encounter: Payer: Self-pay | Admitting: Obstetrics & Gynecology

## 2017-07-13 VITALS — BP 136/74 | HR 98 | Wt 222.0 lb

## 2017-07-13 DIAGNOSIS — Z3A37 37 weeks gestation of pregnancy: Secondary | ICD-10-CM

## 2017-07-13 DIAGNOSIS — O0990 Supervision of high risk pregnancy, unspecified, unspecified trimester: Secondary | ICD-10-CM

## 2017-07-13 DIAGNOSIS — O24419 Gestational diabetes mellitus in pregnancy, unspecified control: Secondary | ICD-10-CM

## 2017-07-13 DIAGNOSIS — O0933 Supervision of pregnancy with insufficient antenatal care, third trimester: Secondary | ICD-10-CM

## 2017-07-13 DIAGNOSIS — O0993 Supervision of high risk pregnancy, unspecified, third trimester: Secondary | ICD-10-CM

## 2017-07-13 DIAGNOSIS — O24410 Gestational diabetes mellitus in pregnancy, diet controlled: Secondary | ICD-10-CM | POA: Diagnosis not present

## 2017-07-13 DIAGNOSIS — Z1389 Encounter for screening for other disorder: Secondary | ICD-10-CM

## 2017-07-13 DIAGNOSIS — Z331 Pregnant state, incidental: Secondary | ICD-10-CM

## 2017-07-13 NOTE — Progress Notes (Signed)
[place] 37+3 wks,cephalic,BPP 8/8,FHR 140 BPM,AFI 17 cm,anterior pl gr 3

## 2017-07-13 NOTE — Progress Notes (Signed)
   HIGH-RISK PREGNANCY VISIT Patient name: Sandra Olson MRN [id number]  Date of birth: [birth year] Chief Complaint:   Routine Prenatal Visit (BPP today)  History of Present Illness:   Sandra Olson is a 27 y.o. [phone] female at [redacted]w[redacted]d with an Estimated Date of Delivery: 07/31/17 being seen today for ongoing management of a high-risk pregnancy complicated by class  A2 DM.  Today she reports backache. Contractions: Irregular. Vag. Bleeding: None.  Movement: Present. denies leaking of fluid.  Review of Systems:   Pertinent items are noted in HPI Denies abnormal vaginal discharge w/ itching/odor/irritation, headaches, visual changes, shortness of breath, chest pain, abdominal pain, severe nausea/vomiting, or problems with urination or bowel movements unless otherwise stated above. Pertinent History Reviewed:  Reviewed past medical,surgical, social, obstetrical and family history.  Reviewed problem list, medications and allergies. Physical Assessment:   Vitals:   07/13/17 1503  BP: 136/74  Pulse: 98  Weight: 222 lb (100.7 kg)  Body mass index is 38.11 kg/m.           Physical Examination:   General appearance: alert, well appearing, and in no distress  Mental status: alert, oriented to person, place, and time  Skin: warm & dry   Extremities: Edema: None    Cardiovascular: normal heart rate noted  Respiratory: normal respiratory effort, no distress  Abdomen: gravid, soft, non-tender  Pelvic: Cervical exam performed  FT/th/soft/midplane/-3        Fetal Status:     Movement: Present    Fetal Surveillance Testing today: BPP 8/8   No results found for this or any previous visit (from the past 24 hour(s)).  Assessment & Plan:  1) High-risk pregnancy G4P3003 at [redacted]w[redacted]d with an Estimated Date of Delivery: 07/31/17   2) Class A2 DM, stable, metformin 1000 BID, suboptimal fasting BS, she will cut out middle of the night juice    Meds: No orders of the defined types were placed in this  encounter.   Labs/procedures today: BPP 8/8  Treatment Plan:  Twice weekly surveillance, IOL 39 weeks, scheduled  Reviewed: Term labor symptoms and general obstetric precautions including but not limited to vaginal bleeding, contractions, leaking of fluid and fetal movement were reviewed in detail with the patient.  All questions were answered.  Follow-up: Return in about 4 days (around 07/17/2017) for NST, HROB.  Orders Placed This Encounter  Procedures  . GC/Chlamydia Probe Amp([facility])  . Culture, beta strep (group b only)  . POCT Urinalysis Dipstick   Lazaro Arms  07/13/2017 3:35 PM

## 2017-07-15 LAB — GC/CHLAMYDIA PROBE AMP
CHLAMYDIA, DNA PROBE: NEGATIVE
Neisseria gonorrhoeae by PCR: NEGATIVE

## 2017-07-17 ENCOUNTER — Ambulatory Visit (INDEPENDENT_AMBULATORY_CARE_PROVIDER_SITE_OTHER): Payer: Medicaid Other | Admitting: Obstetrics and Gynecology

## 2017-07-17 ENCOUNTER — Other Ambulatory Visit: Payer: Self-pay

## 2017-07-17 ENCOUNTER — Encounter: Payer: Self-pay | Admitting: Obstetrics and Gynecology

## 2017-07-17 VITALS — BP 126/78 | HR 86 | Wt 223.0 lb

## 2017-07-17 DIAGNOSIS — O0933 Supervision of pregnancy with insufficient antenatal care, third trimester: Secondary | ICD-10-CM

## 2017-07-17 DIAGNOSIS — Z3A38 38 weeks gestation of pregnancy: Secondary | ICD-10-CM | POA: Diagnosis not present

## 2017-07-17 DIAGNOSIS — O0993 Supervision of high risk pregnancy, unspecified, third trimester: Secondary | ICD-10-CM

## 2017-07-17 DIAGNOSIS — O9989 Other specified diseases and conditions complicating pregnancy, childbirth and the puerperium: Secondary | ICD-10-CM

## 2017-07-17 DIAGNOSIS — Z23 Encounter for immunization: Secondary | ICD-10-CM

## 2017-07-17 DIAGNOSIS — Z331 Pregnant state, incidental: Secondary | ICD-10-CM

## 2017-07-17 DIAGNOSIS — M549 Dorsalgia, unspecified: Secondary | ICD-10-CM

## 2017-07-17 DIAGNOSIS — O24415 Gestational diabetes mellitus in pregnancy, controlled by oral hypoglycemic drugs: Secondary | ICD-10-CM

## 2017-07-17 DIAGNOSIS — Z1389 Encounter for screening for other disorder: Secondary | ICD-10-CM

## 2017-07-17 LAB — OB RESULTS CONSOLE GBS: GBS: NEGATIVE

## 2017-07-17 LAB — POCT URINALYSIS DIPSTICK
Glucose, UA: NEGATIVE
Ketones, UA: NEGATIVE
Leukocytes, UA: NEGATIVE
Nitrite, UA: NEGATIVE
Protein, UA: NEGATIVE
RBC UA: NEGATIVE

## 2017-07-17 LAB — CULTURE, BETA STREP (GROUP B ONLY): STREP GP B CULTURE: NEGATIVE

## 2017-07-17 NOTE — Progress Notes (Addendum)
Patient ID: Sheryn Bison, female   DOB: Mar 16, 1990, 27 y.o.   MRN: [id number]   [facility] PREGNANCY VISIT Patient name: Sandra Olson MRN [id number]  Date of birth: Apr 28, 1990 Chief Complaint:   High Risk Gestation (NST)  History of Present Illness:   Sandra Olson is a 27 y.o. G58P3003 female at [redacted]w[redacted]d with an Estimated Date of Delivery: 07/31/17 being seen today for ongoing management of a high-risk pregnancy complicated by A2 DM.  Today she reports backache. Contractions: Irregular. Vag. Bleeding: None.  Movement: Present. denies leaking of fluid.  Review of Systems:   Pertinent items are noted in HPI Denies abnormal vaginal discharge w/ itching/odor/irritation, headaches, visual changes, shortness of breath, chest pain, abdominal pain, severe nausea/vomiting, or problems with urination or bowel movements unless otherwise stated above. Pertinent History Reviewed:  Reviewed past medical,surgical, social, obstetrical and family history.  Reviewed problem list, medications and allergies. Physical Assessment:   Vitals:   07/17/17 1341  BP: 126/78  Pulse: 86  Weight: 223 lb (101.2 kg)  Body mass index is 38.28 kg/m.           Physical Examination:   General appearance: alert, well appearing, and in no distress  Mental status: alert, oriented to person, place, and time, normal mood, behavior, speech, dress, motor activity, and thought processes, affect appropriate to mood  Skin: warm & dry   Extremities: Edema: None    Cardiovascular: normal heart rate noted  Respiratory: normal respiratory effort, no distress  Abdomen: gravid, soft, non-tender  Pelvic: Cervical exam deferred         Fetal Status: Fetal Heart Rate (bpm): 130-155 nst Fundal Height: 39 cm Movement: Present    Fetal Surveillance Testing today: NST   Results for orders placed or performed in visit on 07/17/17 (from the past 24 hour(s))  POCT urinalysis dipstick   Collection Time: 07/17/17  1:42 PM  Result  Value Ref Range   Color, UA     Clarity, UA     Glucose, UA Negative Negative   Bilirubin, UA     Ketones, UA neg    Spec Grav, UA  1.010 - 1.025   Blood, UA neg    pH, UA  5.0 - 8.0   Protein, UA Negative Negative   Urobilinogen, UA  0.2 or 1.0 E.U./dL   Nitrite, UA neg    Leukocytes, UA Negative Negative   Appearance     Odor      Assessment & Plan:  1) High-risk pregnancy G4P3003 at [redacted]w[redacted]d with an Estimated Date of Delivery: 07/31/17   2) Class A2 DM, stable, metformin 1000 BID,     Meds: No orders of the defined types were placed in this encounter.   Labs/procedures today: NST  Treatment Plan:  2 x week testing, IOL 39 weeks, SCHEDULED FOR 5/29 6:30 AM , FIRST AVAILABLE AFTER 39 WK.  Reviewed: Term labor symptoms and general obstetric precautions including but not limited to vaginal bleeding, contractions, leaking of fluid and fetal movement were reviewed in detail with the patient.  All questions were answered.  Follow-up: Return in about 3 days (around 07/20/2017) for HROB, U/S: Doppler.  Orders Placed This Encounter  Procedures  . Tdap vaccine greater than or equal to 7yo IM  . POCT urinalysis dipstick   By signing my name below, I, Diona Browner, attest that this documentation has been prepared under the direction and in the presence of Tilda Burrow, MD. Electronically Signed: Diona Browner, Medical  Scribe. 07/17/17. 2:29 PM.  I personally performed the services described in this documentation, which was SCRIBED in my presence. The recorded information has been reviewed and considered accurate. It has been edited as necessary during review. Tilda Burrow, MD

## 2017-07-18 ENCOUNTER — Telehealth (HOSPITAL_COMMUNITY): Payer: Self-pay | Admitting: *Deleted

## 2017-07-18 NOTE — Telephone Encounter (Signed)
Preadmission screen  

## 2017-07-19 ENCOUNTER — Encounter (HOSPITAL_COMMUNITY): Payer: Self-pay | Admitting: *Deleted

## 2017-07-19 ENCOUNTER — Telehealth (HOSPITAL_COMMUNITY): Payer: Self-pay | Admitting: *Deleted

## 2017-07-19 NOTE — Telephone Encounter (Signed)
Preadmission screen  

## 2017-07-20 ENCOUNTER — Ambulatory Visit (INDEPENDENT_AMBULATORY_CARE_PROVIDER_SITE_OTHER): Payer: Medicaid Other

## 2017-07-20 ENCOUNTER — Encounter: Payer: Self-pay | Admitting: Advanced Practice Midwife

## 2017-07-20 ENCOUNTER — Ambulatory Visit (INDEPENDENT_AMBULATORY_CARE_PROVIDER_SITE_OTHER): Payer: Medicaid Other | Admitting: Advanced Practice Midwife

## 2017-07-20 VITALS — BP 140/90 | HR 95 | Wt 222.4 lb

## 2017-07-20 DIAGNOSIS — O24410 Gestational diabetes mellitus in pregnancy, diet controlled: Secondary | ICD-10-CM

## 2017-07-20 DIAGNOSIS — Z1389 Encounter for screening for other disorder: Secondary | ICD-10-CM

## 2017-07-20 DIAGNOSIS — Z3A38 38 weeks gestation of pregnancy: Secondary | ICD-10-CM

## 2017-07-20 DIAGNOSIS — O0993 Supervision of high risk pregnancy, unspecified, third trimester: Secondary | ICD-10-CM

## 2017-07-20 DIAGNOSIS — O24415 Gestational diabetes mellitus in pregnancy, controlled by oral hypoglycemic drugs: Secondary | ICD-10-CM

## 2017-07-20 DIAGNOSIS — O24419 Gestational diabetes mellitus in pregnancy, unspecified control: Secondary | ICD-10-CM

## 2017-07-20 DIAGNOSIS — O0990 Supervision of high risk pregnancy, unspecified, unspecified trimester: Secondary | ICD-10-CM

## 2017-07-20 DIAGNOSIS — O0933 Supervision of pregnancy with insufficient antenatal care, third trimester: Secondary | ICD-10-CM

## 2017-07-20 DIAGNOSIS — Z331 Pregnant state, incidental: Secondary | ICD-10-CM

## 2017-07-20 LAB — POCT URINALYSIS DIPSTICK
Blood, UA: NEGATIVE
GLUCOSE UA: NEGATIVE
KETONES UA: NEGATIVE
Leukocytes, UA: NEGATIVE
Nitrite, UA: NEGATIVE
Protein, UA: NEGATIVE

## 2017-07-20 NOTE — Progress Notes (Signed)
[place] 38+3 wks,cephalic,BPP 8/8,EFW 3476 g 64%,afi 27 cm (poly),anterior pl gr 3,FHR 126 BPM

## 2017-07-20 NOTE — Progress Notes (Signed)
HIGH-RISK PREGNANCY VISIT Patient name: Sandra Olson MRN [id number]  Date of birth: [birth year] Chief Complaint:   High Risk Gestation (Bpp)  History of Present Illness:   Sandra Olson is a 27 y.o. G16P3003 female at [redacted]w[redacted]d with an Estimated Date of Delivery: 07/31/17 being seen today for ongoing management of a high-risk pregnancy complicated by gestational DM, class  A2 DM.  Today she reports FBS 8 and 2 hr <120 . Contractions: Irregular.  .  Movement: Present. denies leaking of fluid.  Review of Systems:   Pertinent items are noted in HPI Denies abnormal vaginal discharge w/ itching/odor/irritation, headaches, visual changes, shortness of breath, chest pain, abdominal pain, severe nausea/vomiting, or problems with urination or bowel movements unless otherwise stated above.    Pertinent History Reviewed:  Medical & Surgical Hx:   Past Medical History:  Diagnosis Date  . Gestational diabetes    metformin  . Medical history non-contributory   . Sickle cell trait [facility])    Past Surgical History:  Procedure Laterality Date  . CHOLECYSTECTOMY     Family History  Problem Relation Age of Onset  . Hypertension Mother   . Diabetes Mother   . Hypertension Father   . Diabetes Father   . Sickle cell trait Daughter   . Sickle cell trait Son   . Hypertension Paternal Grandfather   . Diabetes Paternal Grandfather   . Breast cancer Paternal Grandmother   . Hypertension Maternal Grandmother   . Diabetes Maternal Grandmother   . Sickle cell anemia Maternal Grandmother   . Hypertension Paternal Uncle     Current Outpatient Medications:  .  ferrous sulfate 325 (65 FE) MG tablet, Take 1 tablet (325 mg total) by mouth 2 (two) times daily with a meal. (Patient taking differently: Take 325 mg by mouth daily. ), Disp: 60 tablet, Rfl: 3 .  metFORMIN (GLUCOPHAGE) 1000 MG tablet, Take 1 tablet (1,000 mg total) by mouth 2 (two) times daily with a meal., Disp: 60 tablet, Rfl: 1 .  prenatal  vitamin w/FE, FA (NATACHEW) 29-1 MG CHEW chewable tablet, Take 1 tablet po daily, Disp: 30 tablet, Rfl: 11 .  diphenhydrAMINE (BENADRYL) 25 MG tablet, Take 25 mg by mouth every 6 (six) hours as needed., Disp: , Rfl:  Social History: Reviewed -  reports that she has never smoked. She has never used smokeless tobacco.   Physical Assessment:   Vitals:   07/20/17 1142  BP: 140/90  Pulse: 95  Weight: 222 lb 6.4 oz (100.9 kg)  Body mass index is 38.17 kg/m.           Physical Examination:   General appearance: alert, well appearing, and in no distress  Mental status: alert, oriented to person, place, and time  Skin: warm & dry   Extremities: Edema: None    Cardiovascular: normal heart rate noted  Respiratory: normal respiratory effort, no distress  Abdomen: gravid, soft, non-tender  Pelvic: Cervical exam deferred         Fetal Status:     Movement: Present    Fetal Surveillance Testing today: [place] 38+3 wks,cephalic,BPP 8/8,EFW 3476 g 64%,afi 27 cm (poly),anterior pl gr 3,FHR 126 BPM   EFW was 95% 36 weeks (unclear as to why it was repeated today) .   Results for orders placed or performed in visit on 07/20/17 (from the past 24 hour(s))  POCT urinalysis dipstick   Collection Time: 07/20/17 11:49 AM  Result Value Ref Range   Color, UA  Clarity, UA     Glucose, UA Negative Negative   Bilirubin, UA     Ketones, UA neg    Spec Grav, UA  1.010 - 1.025   Blood, UA neg    pH, UA  5.0 - 8.0   Protein, UA Negative Negative   Urobilinogen, UA  0.2 or 1.0 E.U./dL   Nitrite, UA neg    Leukocytes, UA Negative Negative   Appearance     Odor      Assessment & Plan:  1) High-risk pregnancy G4P3003 at [redacted]w[redacted]d with an Estimated Date of Delivery: 07/31/17   2) A2DM, stable    Labs/procedures today:   Medications: metformin  BID  Treatment Plan:  NST Monday, IOL already scheduled for 5/29 (first available)  Follow-up: Return in about 4 days (around 07/24/2017) for HROB,  NST.  Orders Placed This Encounter  Procedures  . POCT urinalysis dipstick   Jacklyn Shell CNM 07/20/2017 12:26 PM

## 2017-07-25 ENCOUNTER — Encounter: Payer: Self-pay | Admitting: Obstetrics & Gynecology

## 2017-07-25 ENCOUNTER — Ambulatory Visit (INDEPENDENT_AMBULATORY_CARE_PROVIDER_SITE_OTHER): Payer: Medicaid Other | Admitting: Obstetrics & Gynecology

## 2017-07-25 ENCOUNTER — Other Ambulatory Visit: Payer: Self-pay | Admitting: Obstetrics and Gynecology

## 2017-07-25 VITALS — BP 132/80 | HR 95 | Wt 219.0 lb

## 2017-07-25 DIAGNOSIS — O0933 Supervision of pregnancy with insufficient antenatal care, third trimester: Secondary | ICD-10-CM

## 2017-07-25 DIAGNOSIS — Z3A39 39 weeks gestation of pregnancy: Secondary | ICD-10-CM | POA: Diagnosis not present

## 2017-07-25 DIAGNOSIS — O24415 Gestational diabetes mellitus in pregnancy, controlled by oral hypoglycemic drugs: Secondary | ICD-10-CM | POA: Diagnosis not present

## 2017-07-25 DIAGNOSIS — Z331 Pregnant state, incidental: Secondary | ICD-10-CM

## 2017-07-25 DIAGNOSIS — O24419 Gestational diabetes mellitus in pregnancy, unspecified control: Secondary | ICD-10-CM

## 2017-07-25 DIAGNOSIS — Z1389 Encounter for screening for other disorder: Secondary | ICD-10-CM

## 2017-07-25 DIAGNOSIS — O0993 Supervision of high risk pregnancy, unspecified, third trimester: Secondary | ICD-10-CM

## 2017-07-25 LAB — POCT URINALYSIS DIPSTICK
GLUCOSE UA: NEGATIVE
Ketones, UA: NEGATIVE
Leukocytes, UA: NEGATIVE
Nitrite, UA: NEGATIVE
Protein, UA: NEGATIVE
RBC UA: NEGATIVE

## 2017-07-25 NOTE — Progress Notes (Signed)
   HIGH-RISK PREGNANCY VISIT Patient name: Sandra Olson MRN [id number]  Date of birth: 1991-02-17 Chief Complaint:   high risk ob (NST/ rash on left hand)  History of Present Illness:   Sandra Olson is a 27 y.o. [phone] female at [redacted]w[redacted]d with an Estimated Date of Delivery: 07/31/17 being seen today for ongoing management of a high-risk pregnancy complicated by class  A2 DM.  Today she reports no complaints. Contractions: Irregular.  .  Movement: Present. denies leaking of fluid.  Review of Systems:   Pertinent items are noted in HPI Denies abnormal vaginal discharge w/ itching/odor/irritation, headaches, visual changes, shortness of breath, chest pain, abdominal pain, severe nausea/vomiting, or problems with urination or bowel movements unless otherwise stated above. Pertinent History Reviewed:  Reviewed past medical,surgical, social, obstetrical and family history.  Reviewed problem list, medications and allergies. Physical Assessment:   Vitals:   07/25/17 1348 07/25/17 1430  BP: (!) 150/100 132/80  Pulse: 95   Weight: 219 lb (99.3 kg)   Body mass index is 37.59 kg/m.           Physical Examination:   General appearance: alert, well appearing, and in no distress  Mental status: alert, oriented to person, place, and time  Skin: warm & dry   Extremities: Edema: None    Cardiovascular: normal heart rate noted  Respiratory: normal respiratory effort, no distress  Abdomen: gravid, soft, non-tender  Pelvic: Cervical exam deferred         Fetal Status:     Movement: Present    Fetal Surveillance Testing today: Reactive NST   No results found for this or any previous visit (from the past 24 hour(s)).  Assessment & Plan:  1) High-risk pregnancy G4P3003 at [redacted]w[redacted]d with an Estimated Date of Delivery: 07/31/17   2) Class A2 DM, stable, EFW 4150 for [redacted] weeks gestation, IOL scheduled, CBG are good  Transient BP elevation, recheck BP normal  Meds: No orders of the defined types were  placed in this encounter.   Labs/procedures today: reactive NST  Treatment Plan:  IOL scheduled for 07/26/2017  Reviewed: Term labor symptoms and general obstetric precautions including but not limited to vaginal bleeding, contractions, leaking of fluid and fetal movement were reviewed in detail with the patient.  All questions were answered.  Follow-up: Return in about 6 weeks (around 09/05/2017) for post partum visit.  Orders Placed This Encounter  Procedures  . POCT urinalysis dipstick   Lazaro Arms  08/09/2017 11:50 AM

## 2017-07-26 ENCOUNTER — Encounter (HOSPITAL_COMMUNITY): Payer: Self-pay

## 2017-07-26 ENCOUNTER — Inpatient Hospital Stay (HOSPITAL_COMMUNITY)
Admission: RE | Admit: 2017-07-26 | Discharge: 2017-07-29 | DRG: 798 | Disposition: A | Discharge status: Home / Self Care | Payer: Medicaid Other | Attending: Obstetrics & Gynecology | Admitting: Obstetrics & Gynecology

## 2017-07-26 ENCOUNTER — Inpatient Hospital Stay (HOSPITAL_COMMUNITY): Payer: Medicaid Other | Admitting: Anesthesiology

## 2017-07-26 DIAGNOSIS — O1414 Severe pre-eclampsia complicating childbirth: Secondary | ICD-10-CM | POA: Diagnosis present

## 2017-07-26 DIAGNOSIS — O99214 Obesity complicating childbirth: Secondary | ICD-10-CM | POA: Diagnosis present

## 2017-07-26 DIAGNOSIS — O24425 Gestational diabetes mellitus in childbirth, controlled by oral hypoglycemic drugs: Principal | ICD-10-CM | POA: Diagnosis present

## 2017-07-26 DIAGNOSIS — O1413 Severe pre-eclampsia, third trimester: Secondary | ICD-10-CM | POA: Diagnosis present

## 2017-07-26 DIAGNOSIS — O9902 Anemia complicating childbirth: Secondary | ICD-10-CM | POA: Diagnosis present

## 2017-07-26 DIAGNOSIS — O0933 Supervision of pregnancy with insufficient antenatal care, third trimester: Secondary | ICD-10-CM

## 2017-07-26 DIAGNOSIS — E669 Obesity, unspecified: Secondary | ICD-10-CM | POA: Diagnosis present

## 2017-07-26 DIAGNOSIS — Z3490 Encounter for supervision of normal pregnancy, unspecified, unspecified trimester: Secondary | ICD-10-CM | POA: Diagnosis present

## 2017-07-26 DIAGNOSIS — Z302 Encounter for sterilization: Secondary | ICD-10-CM

## 2017-07-26 DIAGNOSIS — F329 Major depressive disorder, single episode, unspecified: Secondary | ICD-10-CM | POA: Diagnosis present

## 2017-07-26 DIAGNOSIS — D573 Sickle-cell trait: Secondary | ICD-10-CM | POA: Diagnosis present

## 2017-07-26 DIAGNOSIS — O0990 Supervision of high risk pregnancy, unspecified, unspecified trimester: Secondary | ICD-10-CM

## 2017-07-26 DIAGNOSIS — O24419 Gestational diabetes mellitus in pregnancy, unspecified control: Secondary | ICD-10-CM

## 2017-07-26 DIAGNOSIS — Z3A39 39 weeks gestation of pregnancy: Secondary | ICD-10-CM

## 2017-07-26 DIAGNOSIS — F32A Depression, unspecified: Secondary | ICD-10-CM | POA: Diagnosis present

## 2017-07-26 LAB — CBC
HCT: 33.2 % — ABNORMAL LOW (ref 36.0–46.0)
HEMATOCRIT: 33.3 % — AB (ref 36.0–46.0)
HEMOGLOBIN: 10.6 g/dL — AB (ref 12.0–15.0)
Hemoglobin: 10.8 g/dL — ABNORMAL LOW (ref 12.0–15.0)
MCH: 23.3 pg — AB (ref 26.0–34.0)
MCH: 22.9 pg — ABNORMAL LOW (ref 26.0–34.0)
MCHC: 32.5 g/dL (ref 30.0–36.0)
MCHC: 31.8 g/dL (ref 30.0–36.0)
MCV: 71.7 fL — ABNORMAL LOW (ref 78.0–100.0)
MCV: 72.1 fL — ABNORMAL LOW (ref 78.0–100.0)
PLATELETS: 318 10*3/uL (ref 150–400)
Platelets: 316 10*3/uL (ref 150–400)
RBC: 4.63 MIL/uL (ref 3.87–5.11)
RBC: 4.62 MIL/uL (ref 3.87–5.11)
RDW: 18.2 % — AB (ref 11.5–15.5)
RDW: 18.3 % — ABNORMAL HIGH (ref 11.5–15.5)
WBC: 7.6 10*3/uL (ref 4.0–10.5)
WBC: 8.1 10*3/uL (ref 4.0–10.5)

## 2017-07-26 LAB — GLUCOSE, CAPILLARY
GLUCOSE-CAPILLARY: 88 mg/dL (ref 65–99)
GLUCOSE-CAPILLARY: 100 mg/dL — AB (ref 65–99)
GLUCOSE-CAPILLARY: 92 mg/dL (ref 65–99)
Glucose-Capillary: 96 mg/dL (ref 65–99)

## 2017-07-26 LAB — COMPREHENSIVE METABOLIC PANEL
ALBUMIN: 3.1 g/dL — AB (ref 3.5–5.0)
ALK PHOS: 145 U/L — AB (ref 38–126)
ALT: 25 U/L (ref 14–54)
ALT: 26 U/L (ref 14–54)
ANION GAP: 11 (ref 5–15)
ANION GAP: 12 (ref 5–15)
AST: 44 U/L — ABNORMAL HIGH (ref 15–41)
AST: 46 U/L — AB (ref 15–41)
Albumin: 2.9 g/dL — ABNORMAL LOW (ref 3.5–5.0)
Alkaline Phosphatase: 144 U/L — ABNORMAL HIGH (ref 38–126)
BILIRUBIN TOTAL: 0.9 mg/dL (ref 0.3–1.2)
BUN: <5 mg/dL — ABNORMAL LOW (ref 6–20)
CALCIUM: 9.5 mg/dL (ref 8.9–10.3)
CALCIUM: 9.1 mg/dL (ref 8.9–10.3)
CHLORIDE: 104 mmol/L (ref 101–111)
CO2: 21 mmol/L — AB (ref 22–32)
CO2: 20 mmol/L — AB (ref 22–32)
Chloride: 103 mmol/L (ref 101–111)
Creatinine, Ser: 0.43 mg/dL — ABNORMAL LOW (ref 0.44–1.00)
Creatinine, Ser: 0.43 mg/dL — ABNORMAL LOW (ref 0.44–1.00)
GFR calc Af Amer: >60 mL/min (ref >60)
GFR calc non Af Amer: >60 mL/min (ref >60)
GFR calc non Af Amer: >60 mL/min (ref >60)
GLUCOSE: 105 mg/dL — AB (ref 65–99)
Glucose, Bld: 90 mg/dL (ref 65–99)
POTASSIUM: 3.9 mmol/L (ref 3.5–5.1)
Potassium: 3.6 mmol/L (ref 3.5–5.1)
SODIUM: 136 mmol/L (ref 135–145)
SODIUM: 135 mmol/L (ref 135–145)
TOTAL PROTEIN: 6.8 g/dL (ref 6.5–8.1)
Total Bilirubin: 1 mg/dL (ref 0.3–1.2)
Total Protein: 6.7 g/dL (ref 6.5–8.1)

## 2017-07-26 LAB — PROTEIN / CREATININE RATIO, URINE
Creatinine, Urine: 46 mg/dL
PROTEIN CREATININE RATIO: 0.2 mg/mg{creat} — AB (ref 0.00–0.15)
TOTAL PROTEIN, URINE: 9 mg/dL

## 2017-07-26 LAB — ABO/RH: ABO/RH(D): A POS

## 2017-07-26 LAB — TYPE AND SCREEN
ABO/RH(D): A POS
Antibody Screen: NEGATIVE

## 2017-07-26 MED ORDER — LABETALOL HCL 5 MG/ML IV SOLN: 20.0000 mg | INTRAVENOUS | Status: DC | PRN

## 2017-07-26 MED ORDER — MISOPROSTOL 25 MCG QUARTER TABLET
25.0000 ug | ORAL_TABLET | ORAL | Status: DC | PRN
  Administered 2017-07-26: 25 ug via VAGINAL
  Filled 2017-07-26 (×2): qty 1

## 2017-07-26 MED ORDER — MAGNESIUM SULFATE BOLUS VIA INFUSION
4.0000 g | Freq: Once | INTRAVENOUS | Status: AC
  Administered 2017-07-26: 4 g via INTRAVENOUS
  Filled 2017-07-26: qty 500

## 2017-07-26 MED ORDER — PHENYLEPHRINE 40 MCG/ML (10ML) SYRINGE FOR IV PUSH (FOR BLOOD PRESSURE SUPPORT)
80.0000 ug | PREFILLED_SYRINGE | INTRAVENOUS | Status: DC | PRN
  Filled 2017-07-26: qty 5
  Filled 2017-07-26: qty 10

## 2017-07-26 MED ORDER — OXYTOCIN BOLUS FROM INFUSION
500.0000 mL | Freq: Once | INTRAVENOUS | Status: AC
  Administered 2017-07-26: 500 mL via INTRAVENOUS

## 2017-07-26 MED ORDER — LACTATED RINGERS IV SOLN
INTRAVENOUS | Status: DC
  Administered 2017-07-26 (×3): via INTRAVENOUS

## 2017-07-26 MED ORDER — FENTANYL 2.5 MCG/ML BUPIVACAINE 1/10 % EPIDURAL INFUSION (WH - ANES)
14.0000 mL/h | INTRAMUSCULAR | Status: DC | PRN
  Administered 2017-07-26: 14 mL/h via EPIDURAL
  Filled 2017-07-26: qty 100

## 2017-07-26 MED ORDER — OXYTOCIN 40 UNITS IN LACTATED RINGERS INFUSION - SIMPLE MED
1.0000 m[IU]/min | INTRAVENOUS | Status: DC
  Administered 2017-07-26: 2 m[IU]/min via INTRAVENOUS

## 2017-07-26 MED ORDER — FENTANYL CITRATE (PF) 100 MCG/2ML IJ SOLN
100.0000 ug | INTRAMUSCULAR | Status: DC | PRN
  Administered 2017-07-26 (×3): 100 ug via INTRAVENOUS
  Filled 2017-07-26 (×3): qty 2

## 2017-07-26 MED ORDER — ONDANSETRON HCL 4 MG/2ML IJ SOLN
4.0000 mg | Freq: Four times a day (QID) | INTRAMUSCULAR | Status: DC | PRN
  Administered 2017-07-26: 4 mg via INTRAVENOUS
  Filled 2017-07-26: qty 2

## 2017-07-26 MED ORDER — LIDOCAINE HCL (PF) 1 % IJ SOLN
30.0000 mL | INTRAMUSCULAR | Status: DC | PRN
  Filled 2017-07-26: qty 30

## 2017-07-26 MED ORDER — OXYCODONE-ACETAMINOPHEN 5-325 MG PO TABS: 2.0000 | ORAL_TABLET | ORAL | Status: DC | PRN

## 2017-07-26 MED ORDER — TERBUTALINE SULFATE 1 MG/ML IJ SOLN: 0.2500 mg | Freq: Once | INTRAMUSCULAR | Status: DC | PRN

## 2017-07-26 MED ORDER — EPHEDRINE 5 MG/ML INJ
10.0000 mg | INTRAVENOUS | Status: DC | PRN
  Filled 2017-07-26: qty 2

## 2017-07-26 MED ORDER — HYDRALAZINE HCL 20 MG/ML IJ SOLN: 10.0000 mg | Freq: Once | INTRAMUSCULAR | Status: DC | PRN

## 2017-07-26 MED ORDER — LIDOCAINE HCL (PF) 1 % IJ SOLN
INTRAMUSCULAR | Status: DC | PRN
  Administered 2017-07-26 (×2): 5 mL via EPIDURAL

## 2017-07-26 MED ORDER — LACTATED RINGERS IV SOLN: 500.0000 mL | Freq: Once | INTRAVENOUS | Status: DC

## 2017-07-26 MED ORDER — DIPHENHYDRAMINE HCL 50 MG/ML IJ SOLN: 12.5000 mg | INTRAMUSCULAR | Status: DC | PRN

## 2017-07-26 MED ORDER — MAGNESIUM SULFATE 40 G IN LACTATED RINGERS - SIMPLE
2.0000 g/h | INTRAVENOUS | Status: DC
  Filled 2017-07-26: qty 500

## 2017-07-26 MED ORDER — OXYCODONE-ACETAMINOPHEN 5-325 MG PO TABS: 1.0000 | ORAL_TABLET | ORAL | Status: DC | PRN

## 2017-07-26 MED ORDER — LACTATED RINGERS IV SOLN: 500.0000 mL | INTRAVENOUS | Status: DC | PRN

## 2017-07-26 MED ORDER — OXYTOCIN 40 UNITS IN LACTATED RINGERS INFUSION - SIMPLE MED
2.5000 [IU]/h | INTRAVENOUS | Status: DC
  Filled 2017-07-26: qty 1000

## 2017-07-26 MED ORDER — SOD CITRATE-CITRIC ACID 500-334 MG/5ML PO SOLN
30.0000 mL | ORAL | Status: DC | PRN
Start: 2017-07-26 — End: 2017-07-27

## 2017-07-26 MED ORDER — PHENYLEPHRINE 40 MCG/ML (10ML) SYRINGE FOR IV PUSH (FOR BLOOD PRESSURE SUPPORT)
80.0000 ug | PREFILLED_SYRINGE | INTRAVENOUS | Status: DC | PRN
  Filled 2017-07-26: qty 5

## 2017-07-26 MED ORDER — ACETAMINOPHEN 325 MG PO TABS: 650.0000 mg | ORAL_TABLET | ORAL | Status: DC | PRN

## 2017-07-26 NOTE — Anesthesia Procedure Notes (Signed)
Epidural Patient location during procedure: OB  Staffing Anesthesiologist: Ronniesha Seibold, MD Performed: anesthesiologist   Preanesthetic Checklist Completed: patient identified, site marked, surgical consent, pre-op evaluation, timeout performed, IV checked, risks and benefits discussed and monitors and equipment checked  Epidural Patient position: sitting Prep: DuraPrep Patient monitoring: heart rate, continuous pulse ox and blood pressure Approach: right paramedian Location: L3-L4 Injection technique: LOR saline  Needle:  Needle type: Tuohy  Needle gauge: 17 G Needle length: 9 cm and 9 Needle insertion depth: 6 cm Catheter type: closed end flexible Catheter size: 20 Guage Catheter at skin depth: 10 cm Test dose: negative  Assessment Events: blood not aspirated, injection not painful, no injection resistance, negative IV test and no paresthesia  Additional Notes Patient identified. Risks/Benefits/Options discussed with patient including but not limited to bleeding, infection, nerve damage, paralysis, failed block, incomplete pain control, headache, blood pressure changes, nausea, vomiting, reactions to medication both or allergic, itching and postpartum back pain. Confirmed with bedside nurse the patient's most recent platelet count. Confirmed with patient that they are not currently taking any anticoagulation, have any bleeding history or any family history of bleeding disorders. Patient expressed understanding and wished to proceed. All questions were answered. Sterile technique was used throughout the entire procedure. Please see nursing notes for vital signs. Test dose was given through epidural needle and negative prior to continuing to dose epidural or start infusion. Warning signs of high block given to the patient including shortness of breath, tingling/numbness in hands, complete motor block, or any concerning symptoms with instructions to call for help. Patient was given  instructions on fall risk and not to get out of bed. All questions and concerns addressed with instructions to call with any issues.     

## 2017-07-26 NOTE — Anesthesia Preprocedure Evaluation (Signed)
Anesthesia Evaluation  Patient identified by MRN, date of birth, ID band Patient awake    Reviewed: Allergy & Precautions, H&P , NPO status , Patient's Chart, lab work & pertinent test results  History of Anesthesia Complications Negative for: history of anesthetic complications  Airway Mallampati: II  TM Distance: >3 FB Neck ROM: full    Dental no notable dental hx. (+) Teeth Intact   Pulmonary neg pulmonary ROS,    Pulmonary exam normal breath sounds clear to auscultation       Cardiovascular hypertension, Normal cardiovascular exam Rhythm:regular Rate:Normal     Neuro/Psych negative neurological ROS  negative psych ROS   GI/Hepatic negative GI ROS, Neg liver ROS,   Endo/Other  diabetes  Renal/GU negative Renal ROS  negative genitourinary   Musculoskeletal   Abdominal   Peds  Hematology negative hematology ROS (+)   Anesthesia Other Findings   Reproductive/Obstetrics (+) Pregnancy                             Anesthesia Physical Anesthesia Plan  ASA: II  Anesthesia Plan: Epidural   Post-op Pain Management:    Induction:   PONV Risk Score and Plan:   Airway Management Planned:   Additional Equipment:   Intra-op Plan:   Post-operative Plan:   Informed Consent: I have reviewed the patients History and Physical, chart, labs and discussed the procedure including the risks, benefits and alternatives for the proposed anesthesia with the patient or authorized representative who has indicated his/her understanding and acceptance.     Plan Discussed with:   Anesthesia Plan Comments:         Anesthesia Quick Evaluation

## 2017-07-26 NOTE — Anesthesia Pain Management Evaluation Note (Signed)
  CRNA Pain Management Visit Note  Patient: Sandra Olson, 27 y.o., female  "Hello I am a member of the anesthesia team at [facility]. We have an anesthesia team available at all times to provide care throughout the hospital, including epidural management and anesthesia for C-section. I don't know your plan for the delivery whether it a natural birth, water birth, IV sedation, nitrous supplementation, doula or epidural, but we want to meet your pain goals."   1.Was your pain managed to your expectations on prior hospitalizations?   Yes   2.What is your expectation for pain management during this hospitalization?     Epidural  3.How can we help you reach that goal?   Record the patient's initial score and the patient's pain goal.   Pain: 4  Pain Goal: 7 [facility] wants you to be able to say your pain was always managed very well.  Laban Emperor 07/26/2017

## 2017-07-26 NOTE — H&P (Addendum)
LABOR ADMISSION HISTORY AND PHYSICAL  Sandra Olson is a 27 y.o. female [phone] with IUP at [redacted]w[redacted]d by 2nd trimester [place] presenting for IOL due to A2GDM. She reports +FMs, No LOF, no VB, no blurry vision, headaches or peripheral edema, and RUQ pain.  She plans on Breast feeding. She request BTL for birth control, consent signed.  Prenatal History/Complications: PNC Office: [facility] A2GDM  H/o GHTN Obesity [place] trait  Past Medical History: Past Medical History:  Diagnosis Date  . Gestational diabetes    metformin  . Medical history non-contributory   . Sickle cell trait [facility]     Past Surgical History: Past Surgical History:  Procedure Laterality Date  . CHOLECYSTECTOMY      Obstetrical History: OB History    Gravida  4   Para  3   Term  3   Preterm      AB      Living  3     SAB      TAB      Ectopic      Multiple      Live Births  3           Social History: Social History   Socioeconomic History  . Marital status: Single    Spouse name: Not on file  . Number of children: Not on file  . Years of education: Not on file  . Highest education level: Not on file  Occupational History  . Not on file  Social Needs  . Financial resource strain: Not on file  . Food insecurity:    Worry: Not on file    Inability: Not on file  . Transportation needs:    Medical: Not on file    Non-medical: Not on file  Tobacco Use  . Smoking status: Never Smoker  . Smokeless tobacco: Never Used  Substance and Sexual Activity  . Alcohol use: No  . Drug use: No  . Sexual activity: Yes    Birth control/protection: None  Lifestyle  . Physical activity:    Days per week: Not on file    Minutes per session: Not on file  . Stress: Not on file  Relationships  . Social connections:    Talks on phone: Not on file    Gets together: Not on file    Attends religious service: Not on file    Active member of club or organization: Not on file    Attends meetings  of clubs or organizations: Not on file    Relationship status: Not on file  Other Topics Concern  . Not on file  Social History Narrative  . Not on file    Family History: Family History  Problem Relation Age of Onset  . Hypertension Mother   . Diabetes Mother   . Hypertension Father   . Diabetes Father   . Sickle cell trait Daughter   . Sickle cell trait Son   . Hypertension Paternal Grandfather   . Diabetes Paternal Grandfather   . Breast cancer Paternal Grandmother   . Hypertension Maternal Grandmother   . Diabetes Maternal Grandmother   . Sickle cell anemia Maternal Grandmother   . Hypertension Paternal Uncle     Allergies: Allergies  Allergen Reactions  . Codeine Hives    Medications Prior to Admission  Medication Sig Dispense Refill Last Dose  . diphenhydrAMINE (BENADRYL) 25 MG tablet Take 25 mg by mouth every 6 (six) hours as needed.   Taking  .  ferrous sulfate 325 (65 FE) MG tablet Take 1 tablet (325 mg total) by mouth 2 (two) times daily with a meal. (Patient taking differently: Take 325 mg by mouth daily. ) 60 tablet 3 Taking  . metFORMIN (GLUCOPHAGE) 1000 MG tablet Take 1 tablet (1,000 mg total) by mouth 2 (two) times daily with a meal. 60 tablet 1 Taking  . prenatal vitamin w/FE, FA (NATACHEW) 29-1 MG CHEW chewable tablet Take 1 tablet po daily 30 tablet 11 Taking    Review of Systems   All systems reviewed and negative except as stated in HPI  Physical Exam Blood pressure (!) 156/94, pulse 85, temperature 98 F (36.7 C), resp. rate 18, height  (1.626 m), weight 219 lb 14.4 oz (99.7 kg), last menstrual period 02/11/2017, currently breastfeeding. General appearance: alert and no distress HEENT: no JVD, supple neck Abdomen: soft, non-tender; bowel sounds normal Extremities: Homans sign is negative, no sign of DVT Presentation: unsure  Pelvis: 2/50/-2 Fetal monitoringBaseline: 135 bpm, Variability: Good {> 6 bpm), Accelerations: Non-reactive but  appropriate for gestational age and Decelerations: Absent Uterine activity: irregular   Dating: By 2nd Trimester [place] 23weeks --->  Estimated Date of Delivery: 07/31/17  [place] BPP - EFW 3476g (64%) @ [redacted]w[redacted]d [place] - EFW 3475g (95%), @ [redacted]w[redacted]d  Prenatal labs: ABO, Rh: A/Positive/-- (03/11 1658) Antibody: Negative (03/11 1658) Rubella: 2.39 (03/11 1658) RPR: Non Reactive (03/11 1658)  HBsAg: Negative (03/11 1658)  HIV: Non Reactive (03/11 1658)  GBS:   Negative 2 hr Glucola elevated 95, 187, 150  Prenatal Transfer Tool  Maternal Diabetes: Yes:  Diabetes Type:  Insulin/Medication controlled Genetic Screening: Declined Maternal Ultrasounds/Referrals3: None Fetal Ultrasounds or other Referrals:  None Maternal Substance Abuse:  No Significant Maternal Medications:  Meds include: Other: metformin Significant Maternal Lab Results: None  Results for orders placed or performed in visit on 07/25/17 (from the past 24 hour(s))  POCT urinalysis dipstick   Collection Time: 07/25/17  1:52 PM  Result Value Ref Range   Color, UA     Clarity, UA     Glucose, UA Negative Negative   Bilirubin, UA     Ketones, UA neg    Spec Grav, UA  1.010 - 1.025   Blood, UA neg    pH, UA  5.0 - 8.0   Protein, UA Negative Negative   Urobilinogen, UA  0.2 or 1.0 E.U./dL   Nitrite, UA neg    Leukocytes, UA Negative Negative   Appearance     Odor      Patient Active Problem List   Diagnosis Date Noted  . Encounter for planned induction of labor 07/26/2017  . Encounter for induction of labor 07/26/2017  . Abnormal glucose tolerance test (GTT) during pregnancy, antepartum 06/30/2017  . Gestational diabetes mellitus, class A2 06/14/2017  . Supervision of high risk pregnancy, antepartum 05/08/2017  . Late prenatal care in third trimester 05/08/2017  . Depression 03/27/2017  . Sickle cell trait (HCC) 01/14/2015  . History of gestational hypertension 01/14/2015    Assessment: Sandra Olson is a 27 y.o. G4P3003  at [redacted]w[redacted]d here for IOL for A2GDM. Also has elevated BP on admission, 150/90  #Labor: determine fetal presentation, if cephalic, will start with buccal cytotec and FB, follow labor progress #GDM: CBG q4h, q2h in active labor, insulin gtt if CBG > 140 #GHTN vs. Pre-Eclampsia: Follow BP, CMP, Urine Pro/Cr pending, low threshold to start medication and Mg, if BP > 160x2 #Pain: Patient would like epidural #FWB:  Cat I #ID:  GBS neg #MOF: Breast #MOC:BTL  Thomes Dinning, MD, MS FAMILY MEDICINE RESIDENT - PGY1 07/26/2017 10:07 AM  OB FELLOW HISTORY AND PHYSICAL ATTESTATION I have seen and examined this patient; I agree with above documentation in the resident's note.   27 y.o. G4P3003 at 107w2d here for IOL for A2GDM (on metformin).  SVE 2cm/thick/bollatable. Vertex by bedside U/S  IOL: FB placed and vaginal cytotec started for cervical ripening FWB: Cat I GBS neg GDM: monitor CBGs q4 h in latent labor, q2 in active labor GHTN: new dx; had BP 150/100 last office visit; initial BPs here 140s-150s/90s; asymptomatic. Check CBC, CMP, UPC. Monitor for signs and symptoms of severe pre-eclampsia.  Frederik Pear, MD OB Fellow 07/26/2017, 11:41 AM

## 2017-07-26 NOTE — Progress Notes (Signed)
LABOR PROGRESS NOTE  Sandra Olson is a 27 y.o. [phone] at [redacted]w[redacted]d  admitted for IOL for GDM  Subjective: Patient doing well. Reports feeling crampy, otherwise denies any RUQ/epigastric pain, headache, blurry vision, visual disturbances, or SOB.  Objective: Patient Vitals for the past 8 hrs:  BP Temp Pulse Resp Height Weight  07/26/17 1314 139/79 - 74 16 - -  07/26/17 1301 (!) 161/99 - 73 18 - -  07/26/17 1244 (!) 162/94 - 80 - - -  07/26/17 1241 (!) 175/94 - 85 - - -  07/26/17 1140 (!) 156/94 - 85 18 - -  07/26/17 1042 - - - -  (1.626 m) 219 lb 14.4 oz (99.7 kg)  07/26/17 1014 (!) 148/94 - 85 18 - -  07/26/17 1012 - 98 F (36.7 C) - - - -    Last SVE: Dilation: 2 Effacement (%): Thick Station: Ballotable Presentation: Vertex Exam by:: Dr Nira Retort   FB remains in place FHT: baseline rate 135, moderate varibility, +acel, no decel Toco: ctx q 2-6 min  Labs: CBC    Component Value Date/Time   WBC 7.6 07/26/2017 1040   RBC 4.63 07/26/2017 1040   HGB 10.8 (L) 07/26/2017 1040   HGB 10.1 (L) 05/08/2017 1658   HCT 33.2 (L) 07/26/2017 1040   HCT 32.1 (L) 05/08/2017 1658   PLT 318 07/26/2017 1040   PLT 340 05/08/2017 1658   MCV 71.7 (L) 07/26/2017 1040   MCV 77 (L) 05/08/2017 1658   MCH 23.3 (L) 07/26/2017 1040   MCHC 32.5 07/26/2017 1040   RDW 18.2 (H) 07/26/2017 1040   RDW 15.6 (H) 05/08/2017 1658   LYMPHSABS 2.1 05/08/2017 1658   MONOABS 0.8 12/09/2009 2108   EOSABS 0.2 05/08/2017 1658   BASOSABS 0.0 05/08/2017 1658   CMP     Component Value Date/Time   NA 136 07/26/2017 1040   NA 140 05/08/2017 1658   K 3.9 07/26/2017 1040   CL 104 07/26/2017 1040   CO2 21 (L) 07/26/2017 1040   GLUCOSE 90 07/26/2017 1040   BUN <5 (L) 07/26/2017 1040   BUN 3 (L) 05/08/2017 1658   CREATININE 0.43 (L) 07/26/2017 1040   CALCIUM 9.5 07/26/2017 1040   PROT 6.7 07/26/2017 1040   PROT 6.6 05/08/2017 1658   ALBUMIN 2.9 (L) 07/26/2017 1040   ALBUMIN 3.6 05/08/2017 1658   AST 44 (H) 07/26/2017 1040   ALT 25 07/26/2017 1040   ALKPHOS 144 (H) 07/26/2017 1040   BILITOT 1.0 07/26/2017 1040   BILITOT 0.3 05/08/2017 1658   GFRNONAA >60 07/26/2017 1040   GFRAA >60 07/26/2017 1040     Assessment / Plan: 27 y.o. G4P3003 at [redacted]w[redacted]d here for IOL for GDM.  Now also with elevated Bps  PIH: Blood pressures elevated here, new dx of GHTN. She is asymptomatic. UPC still pending. Ptl, LFTs, , renal function all wnl. BP have been in mild range, but at 12:41 pt had severe range BP right after returning from walking, and pt reports being stressed right after getting a phone call. BP re-checked 15 minutes later and still severe range. I went to the room to re-evaluate, and pt denies any severe feature symptoms. She was laying on her right side on the BP cuff on the right arm. Pt changed position and BP retaken, and BP 139/79 (w/o any treatment). Will hold off on Mag at this time, as severe range BP seems to be d/t other factors with spontaneous resolution, but continue  to monitor closely for signs/sx of preeclampsia with severe features  Labor: FB in place, s/p [facility] cytotec x1  Fetal Wellbeing:  Cat I Pain Control: per patient's request Anticipated MOD:  SVD  Frederik Pear, MD 07/26/2017, 1:39 PM

## 2017-07-26 NOTE — Progress Notes (Addendum)
Patient ID: Sandra Olson, female   DOB: 18-Mar-1990, 27 y.o.   MRN: [id number]  Just had SROM @ 2140, wanting epidural  BP 137/98, other VSS FHR 130s, +accels, no decels Ctx q 2-4 mins, becoming stronger Last cx exam: 4+/thick/post @ 1745  Repeat CBC/CMP essentially unchanged since this AM  IUP@term  A2GDM Pre-e w/ severe features Beginning active labor  Will have epidural placed and then plan on cx exam in the next 1-2 hrs Anticipate SVD  Cam Hai CNM 07/26/2017 10:06 PM

## 2017-07-26 NOTE — Progress Notes (Signed)
LABOR PROGRESS NOTE  Sandra Olson is a 27 y.o. [phone] at [redacted]w[redacted]d  admitted for IOL for GDM  Subjective: Patient feeling some nausea, otherwise denies any other concerns. Has had on/off upper abdominal pain, but has improved.   Objective: Patient Vitals for the past 4 hrs:  BP Temp Temp src Pulse Resp  07/26/17 1912 (!) 153/93 97.8 F (36.6 C) Oral 79 18  07/26/17 1901 (!) 148/94 - - 77 18  07/26/17 1831 (!) 159/97 - - 82 18  07/26/17 1817 (!) 166/106 - - 79 -  07/26/17 1804 (!) 168/108 - - 86 18  07/26/17 1731 127/73 - - 77 16  07/26/17 1701 (!) 143/77 - - 81 18  07/26/17 1631 (!) 128/51 98 F (36.7 C) - 75 18  07/26/17 1601 (!) 146/93 - - 75 16  07/26/17 1531 137/90 - - 83 16    Last SVE: Dilation: 4.5 Effacement (%): Thick Cervical Position: Posterior Station: Ballotable Presentation: Vertex Exam by:: Dr Nira Retort   FHT: baseline rate 13, moderate varibility, +acel, no decel Toco: ctx q 1-4 min  CBG (last 3)  Recent Labs    07/26/17 1050 07/26/17 1508 07/26/17 1902  GLUCAP 88 100* 92     Assessment / Plan: 27 y.o. G4P3003 at [redacted]w[redacted]d here for IOL for GDM.   GHTN-->PreE w/ SF: Severe range BP again, now >4h apart. IV mag started. Labetalol protocol to treat. Will repeat CBC/CMP GDM: CBGs at goal  Labor:  Continue IV Pitocin. Plan to AROM when fetal head applied Fetal Wellbeing:  Cat I Pain Control: IV pain meds prn. Planning on getting epidural Anticipated MOD:  SVD  Frederik Pear, MD 07/26/2017, 7:20 PM

## 2017-07-26 NOTE — Progress Notes (Signed)
LABOR PROGRESS NOTE  Sandra Olson is a Sandra y.o. [phone] at [redacted]w[redacted]d  admitted for IOL due to A2GDM.   Subjective: Patient resting in bed and comfortable; reports no complaints or concerns. Patient denies headache, vision changes, SOB, chest pain, leg swelling, or any other recent changes. Patient feels comfortable overall.  Objective: BP (!) 162/94   Pulse 80   Temp 98 F (36.7 C)   Resp 18   Ht  (1.626 m)   Wt 219 lb 14.4 oz (99.7 kg)   LMP 02/11/2017 (Approximate)   BMI 37.Sandra kg/m  or  Vitals:   07/26/17 1014 07/26/17 1042 07/26/17 1140 07/26/17 1244  BP: (!) 148/94  (!) 156/94 (!) 162/94  Pulse: 85  85 80  Resp: 18  18   Temp:      Weight:  219 lb 14.4 oz (99.7 kg)    Height:   (1.626 m)       Dilation: 2 Effacement (%): Thick Station: Ballotable Presentation: Vertex Exam by:: Dr Nira Retort FHT: Baseline: 140 bpm, Variability: Appropriate (>5bmp), Accelerations: Non-reactive but appropriate for gestational age and Decelerations: Absent Uterine activity: irregular  Labs: Lab Results  Component Value Date   WBC 7.6 07/26/2017   HGB 10.8 (L) 07/26/2017   HCT 33.2 (L) 07/26/2017   MCV 71.7 (L) 07/26/2017   PLT 318 07/26/2017    Patient Active Problem List   Diagnosis Date Noted  . Encounter for planned induction of labor 07/26/2017  . Encounter for induction of labor 07/26/2017  . Abnormal glucose tolerance test (GTT) during pregnancy, antepartum 06/30/2017  . Gestational diabetes mellitus, class A2 06/14/2017  . Supervision of high risk pregnancy, antepartum 05/08/2017  . Late prenatal care in third trimester 05/08/2017  . Depression 03/27/2017  . Sickle cell trait (HCC) 01/14/2015  . History of gestational hypertension 01/14/2015    Assessment / Plan: Sandra y.o. G4P3003 at [redacted]w[redacted]d here for IOL due to A2GDM. Patient's blood pressure has increased since admission, most recently at 162/94. Given that this is over the threshold for severe criteria that  would warrant treatment, we will repeat pressure reading in 15 minutes and consider medical management if repeat measurement is similarly elevated. Patient's urine creatinine:protein ratio still pending. However, patient is asymptomatic, which is reassuring for preeclampsia or eclampsia.   Labor: S/p FB and vaginal cytotec. Progressing on Pitocin. Continue to monitor. Elevated Blood Pressure: Repeat measurement in 15 minutes. Consider medical management if elevated above 160 systolic. GDM: Stable. Continue with diabetes protocol. Fetal Wellbeing:  Category I Pain Control:  Well-controlled. Continue to manage. Anticipated MOD:  SVD  Cyndee Brightly, Medical Student  MS3 5/29/201912:52 PM

## 2017-07-26 NOTE — Progress Notes (Signed)
LABOR PROGRESS NOTE  Sandra Olson is a 27 y.o. [phone] at [redacted]w[redacted]d  admitted for IOL for GDM  Subjective: Patient doing well. Feeling some contractions  Objective: Patient Vitals for the past 4 hrs:  BP Temp Pulse Resp  07/26/17 1731 127/73 - 77 16  07/26/17 1701 (!) 143/77 - 81 18  07/26/17 1631 (!) 128/51 98 F (36.7 C) 75 18  07/26/17 1601 (!) 146/93 - 75 16  07/26/17 1531 137/90 - 83 16  07/26/17 1455 (!) 141/83 - 75 16  07/26/17 1420 (!) 156/102 - 87 18  07/26/17 1401 (!) 161/95 - 77 16    Last SVE: Dilation: 4.5 Effacement (%): Thick Cervical Position: Posterior Station: Ballotable Presentation: Vertex Exam by:: Dr Nira Retort   FB remains in place FHT: baseline rate 130, moderate varibility, +acel, no decel Toco: ctx q 14 min  CBG (last 3)  Recent Labs    07/26/17 1050 07/26/17 1508  GLUCAP 88 100*     Assessment / Plan: 27 y.o. G4P3003 at [redacted]w[redacted]d here for IOL for GDM.   GHTN: Labs wnl. Continue to monitor BPs Labor:  Start IV Pitocn Fetal Wellbeing:  Cat I Pain Control: per patient's request. Planning on getting epidural Anticipated MOD:  SVD  Frederik Pear, MD 07/26/2017, 5:46 PM

## 2017-07-27 ENCOUNTER — Encounter (HOSPITAL_COMMUNITY): Payer: Self-pay

## 2017-07-27 ENCOUNTER — Other Ambulatory Visit: Payer: Self-pay

## 2017-07-27 DIAGNOSIS — O1413 Severe pre-eclampsia, third trimester: Secondary | ICD-10-CM | POA: Diagnosis present

## 2017-07-27 DIAGNOSIS — O24425 Gestational diabetes mellitus in childbirth, controlled by oral hypoglycemic drugs: Secondary | ICD-10-CM

## 2017-07-27 DIAGNOSIS — Z3A39 39 weeks gestation of pregnancy: Secondary | ICD-10-CM

## 2017-07-27 DIAGNOSIS — O1414 Severe pre-eclampsia complicating childbirth: Secondary | ICD-10-CM

## 2017-07-27 LAB — RPR: RPR: NONREACTIVE

## 2017-07-27 MED ORDER — MAGNESIUM SULFATE 40 G IN LACTATED RINGERS - SIMPLE
2.0000 g/h | INTRAVENOUS | Status: AC
  Administered 2017-07-27: 2 g/h via INTRAVENOUS
  Filled 2017-07-27: qty 40

## 2017-07-27 MED ORDER — PRENATAL MULTIVITAMIN CH
1.0000 | ORAL_TABLET | Freq: Every day | ORAL | Status: DC
  Administered 2017-07-27: 1 via ORAL
  Filled 2017-07-27: qty 1

## 2017-07-27 MED ORDER — DIPHENHYDRAMINE HCL 25 MG PO CAPS
25.0000 mg | ORAL_CAPSULE | Freq: Four times a day (QID) | ORAL | Status: DC | PRN
  Administered 2017-07-27: 25 mg via ORAL
  Filled 2017-07-27: qty 1

## 2017-07-27 MED ORDER — SIMETHICONE 80 MG PO CHEW: 80.0000 mg | CHEWABLE_TABLET | ORAL | Status: DC | PRN

## 2017-07-27 MED ORDER — MAGNESIUM SULFATE 40 G IN LACTATED RINGERS - SIMPLE: 2.0000 g/h | INTRAVENOUS | Status: DC

## 2017-07-27 MED ORDER — ONDANSETRON HCL 4 MG/2ML IJ SOLN
4.0000 mg | INTRAMUSCULAR | Status: DC | PRN
  Administered 2017-07-28: 4 mg via INTRAVENOUS
  Filled 2017-07-27: qty 2

## 2017-07-27 MED ORDER — DIBUCAINE 1 % RE OINT: 1.0000 "application " | TOPICAL_OINTMENT | RECTAL | Status: DC | PRN

## 2017-07-27 MED ORDER — SENNOSIDES-DOCUSATE SODIUM 8.6-50 MG PO TABS: 2.0000 | ORAL_TABLET | ORAL | Status: DC

## 2017-07-27 MED ORDER — TETANUS-DIPHTH-ACELL PERTUSSIS 5-2.5-18.5 LF-MCG/0.5 IM SUSP: 0.5000 mL | Freq: Once | INTRAMUSCULAR | Status: DC

## 2017-07-27 MED ORDER — ONDANSETRON HCL 4 MG PO TABS: 4.0000 mg | ORAL_TABLET | ORAL | Status: DC | PRN

## 2017-07-27 MED ORDER — OXYCODONE HCL 5 MG PO TABS
5.0000 mg | ORAL_TABLET | ORAL | Status: DC | PRN
  Administered 2017-07-27 – 2017-07-29 (×2): 5 mg via ORAL
  Filled 2017-07-27 (×2): qty 1

## 2017-07-27 MED ORDER — ZOLPIDEM TARTRATE 5 MG PO TABS: 5.0000 mg | ORAL_TABLET | Freq: Every evening | ORAL | Status: DC | PRN

## 2017-07-27 MED ORDER — SENNOSIDES-DOCUSATE SODIUM 8.6-50 MG PO TABS: 2.0000 | ORAL_TABLET | Freq: Every evening | ORAL | Status: DC | PRN

## 2017-07-27 MED ORDER — IBUPROFEN 600 MG PO TABS
600.0000 mg | ORAL_TABLET | Freq: Four times a day (QID) | ORAL | Status: DC
  Administered 2017-07-27 – 2017-07-29 (×8): 600 mg via ORAL
  Filled 2017-07-27 (×8): qty 1

## 2017-07-27 MED ORDER — NIFEDIPINE ER OSMOTIC RELEASE 30 MG PO TB24
30.0000 mg | ORAL_TABLET | Freq: Every day | ORAL | Status: DC
  Administered 2017-07-27 – 2017-07-29 (×3): 30 mg via ORAL
  Filled 2017-07-27 (×3): qty 1

## 2017-07-27 MED ORDER — ACETAMINOPHEN 325 MG PO TABS: 650.0000 mg | ORAL_TABLET | ORAL | Status: DC | PRN

## 2017-07-27 MED ORDER — WITCH HAZEL-GLYCERIN EX PADS: 1.0000 "application " | MEDICATED_PAD | CUTANEOUS | Status: DC | PRN

## 2017-07-27 MED ORDER — BENZOCAINE-MENTHOL 20-0.5 % EX AERO: 1.0000 "application " | INHALATION_SPRAY | CUTANEOUS | Status: DC | PRN

## 2017-07-27 MED ORDER — COCONUT OIL OIL
1.0000 "application " | TOPICAL_OIL | Status: DC | PRN
  Administered 2017-07-27: 1 via TOPICAL
  Filled 2017-07-27: qty 120

## 2017-07-27 MED ORDER — LACTATED RINGERS IV SOLN: INTRAVENOUS | Status: AC

## 2017-07-27 NOTE — Progress Notes (Signed)
Dr. Vergie Living notified of T 101.3 Rn to recheck temp, if still elevated administer  acetaminophen.

## 2017-07-27 NOTE — Anesthesia Preprocedure Evaluation (Addendum)
Anesthesia Evaluation  Patient identified by MRN, date of birth, ID band Patient awake    Reviewed: Allergy & Precautions, NPO status , Patient's Chart, lab work & pertinent test results  Airway Mallampati: III  TM Distance: >3 FB Neck ROM: Full    Dental  (+) Teeth Intact, Dental Advisory Given   Pulmonary    breath sounds clear to auscultation       Cardiovascular hypertension,  Rhythm:Regular Rate:Normal     Neuro/Psych PSYCHIATRIC DISORDERS Depression negative neurological ROS     GI/Hepatic negative GI ROS, Neg liver ROS,   Endo/Other  diabetes, Gestational, Oral Hypoglycemic Agents  Renal/GU negative Renal ROS  negative genitourinary   Musculoskeletal negative musculoskeletal ROS (+)   Abdominal   Peds  Hematology  (+) Sickle cell trait ,   Anesthesia Other Findings   Reproductive/Obstetrics                            Anesthesia Physical Anesthesia Plan  ASA: II  Anesthesia Plan: Epidural   Post-op Pain Management:    Induction: Intravenous  PONV Risk Score and Plan: Propofol infusion, Ondansetron and Midazolam  Airway Management Planned: Simple Face Mask  Additional Equipment: None  Intra-op Plan:   Post-operative Plan:   Informed Consent: I have reviewed the patients History and Physical, chart, labs and discussed the procedure including the risks, benefits and alternatives for the proposed anesthesia with the patient or authorized representative who has indicated his/her understanding and acceptance.     Plan Discussed with: CRNA  Anesthesia Plan Comments:        Anesthesia Quick Evaluation

## 2017-07-27 NOTE — Lactation Note (Signed)
This note was copied from a baby's chart. Lactation Consultation Note  Patient Name: Sandra Olson [id number] Date: 07/27/2017  Mom reports baby is feeding well.  Instructed to feed with cues and call for assist prn.   Maternal Data    Feeding    LATCH Score                   Interventions    Lactation Tools Discussed/Used     Consult Status      Sandra Olson 07/27/2017, 12:42 PM

## 2017-07-27 NOTE — Lactation Note (Signed)
This note was copied from a baby's chart. Lactation Consultation Note  Patient Name: Sandra Olson [id number] Date: 07/27/2017 Reason for consult: Initial assessment;Term  P4 mother whose infant is now 33 hours old.  Infant was ready to feed as I entered the room.  Mother was concerned about her latch and I offered assistance which she willingly accepted.  Mother's breasts are soft and non tender with everted nipples.  No trauma noted.  Mother prefers the cradle position.  Since infant is awake and ready to feed I assisted her in this position.  Baby was able to open wide with flanged lips.  Rhythmic sucking initiated without difficulty.  Discussed with mother the importance of making sure infant has a deep latch in this position.  Informed her that this may not be he best position for a newborn learning to feed.  Mother verbalized understanding.    When infant self released and was ready to try on the other breast I suggested the cross cradle hold.  Mother agreed and needed some education on this position.  After baby latched and began sucking mother stated that this position felt better and she had no pain during feeding.    Encouraged feeding 8-12 times/24 hours or earlier if she shows feeding cues.  Reviewed feeding cues.  Continue STS, breast massage and hand expression.  Mom made aware of O/P services, breastfeeding support groups, community resources, and our phone # for post-discharge questions. Mother will call for assistance as needed.  Grandmother is present and supportive.  Mother also has her 3 children in the room for the night.  Mother stated the FOB will return later today.     Maternal Data Formula Feeding for Exclusion: No Has patient been taught Hand Expression?: Yes Does the patient have breastfeeding experience prior to this delivery?: Yes  Feeding Feeding Type: Breast Fed Length of feed: 15 min(still feeding)  LATCH Score Latch: Grasps breast easily, tongue down,  lips flanged, rhythmical sucking.  Audible Swallowing: A few with stimulation  Type of Nipple: Everted at rest and after stimulation  Comfort (Breast/Nipple): Soft / non-tender  Hold (Positioning): Assistance needed to correctly position infant at breast and maintain latch.  LATCH Score: 8  Interventions Interventions: Breast feeding basics reviewed;Assisted with latch;Skin to skin;Breast massage;Hand express;Position options;Support pillows;Adjust position;Breast compression  Lactation Tools Discussed/Used WIC Program: Yes   Consult Status Consult Status: Follow-up Date: 07/28/17 Follow-up type: In-patient    Teller Wakefield R Luz Mares 07/27/2017, 4:19 AM

## 2017-07-27 NOTE — Anesthesia Postprocedure Evaluation (Signed)
Anesthesia Post Note  Patient: Sandra Olson  Procedure(s) Performed: AN AD HOC LABOR EPIDURAL     Patient location during evaluation: [facility] Anesthesia Type: Epidural Level of consciousness: awake and alert Pain management: pain level controlled Vital Signs Assessment: post-procedure vital signs reviewed and stable Respiratory status: spontaneous breathing, nonlabored ventilation and respiratory function stable Cardiovascular status: stable Postop Assessment: no headache, no backache, epidural receding, patient able to bend at knees, no apparent nausea or vomiting and able to ambulate Anesthetic complications: no    Last Vitals:  Vitals:   07/27/17 0500 07/27/17 0614  BP:    Pulse:    Resp: 18 19  Temp:    SpO2:      Last Pain:  Vitals:   07/27/17 0730  TempSrc:   PainSc: 3    Pain Goal:                 [employer]

## 2017-07-27 NOTE — Progress Notes (Signed)
OB Note  Patient rounded on by Dr. Despina Hidden this morning. D/w her that unfortunately OR can't do her case today but can do her BTL tomorrow. R/b/a/ d/w her and pt still desires BTL. Orders to eat and NPO p MN for patient. Will start procardia 30 qday. No s/s of pre-eclampsia and great UOP.   Cornelia Copa MD Attending Center for [employer] (Faculty Practice) 07/27/2017 Time: [phone]

## 2017-07-28 ENCOUNTER — Inpatient Hospital Stay (HOSPITAL_COMMUNITY): Payer: Medicaid Other | Admitting: Anesthesiology

## 2017-07-28 ENCOUNTER — Encounter (HOSPITAL_COMMUNITY): Payer: Self-pay | Admitting: Obstetrics and Gynecology

## 2017-07-28 ENCOUNTER — Encounter (HOSPITAL_COMMUNITY)
Admission: RE | Disposition: A | Discharge status: Home / Self Care | Payer: Self-pay | Source: Ambulatory Visit | Attending: Obstetrics & Gynecology

## 2017-07-28 DIAGNOSIS — Z302 Encounter for sterilization: Secondary | ICD-10-CM

## 2017-07-28 HISTORY — PX: TUBAL LIGATION: SHX77

## 2017-07-28 LAB — CBC
HEMATOCRIT: 29.9 % — AB (ref 36.0–46.0)
HEMOGLOBIN: 9.5 g/dL — AB (ref 12.0–15.0)
MCH: 23.1 pg — ABNORMAL LOW (ref 26.0–34.0)
MCHC: 31.8 g/dL (ref 30.0–36.0)
MCV: 72.7 fL — ABNORMAL LOW (ref 78.0–100.0)
Platelets: 290 10*3/uL (ref 150–400)
RBC: 4.11 MIL/uL (ref 3.87–5.11)
RDW: 18.3 % — ABNORMAL HIGH (ref 11.5–15.5)
WBC: 8.4 10*3/uL (ref 4.0–10.5)

## 2017-07-28 LAB — GLUCOSE, CAPILLARY
GLUCOSE-CAPILLARY: 89 mg/dL (ref 65–99)
Glucose-Capillary: 84 mg/dL (ref 65–99)
Glucose-Capillary: 93 mg/dL (ref 65–99)

## 2017-07-28 SURGERY — LIGATION, FALLOPIAN TUBE, POSTPARTUM
Anesthesia: Epidural | Site: Abdomen | Laterality: Bilateral | Wound class: Clean Contaminated

## 2017-07-28 MED ORDER — NALBUPHINE HCL 10 MG/ML IJ SOLN: 5.0000 mg | Freq: Once | INTRAMUSCULAR | Status: DC | PRN

## 2017-07-28 MED ORDER — SODIUM BICARBONATE 8.4 % IV SOLN
INTRAVENOUS | Status: DC | PRN
  Administered 2017-07-28: 5 mL via EPIDURAL
  Administered 2017-07-28: 2 mL via EPIDURAL
  Administered 2017-07-28: 3 mL via EPIDURAL
  Administered 2017-07-28: 7 mL via EPIDURAL

## 2017-07-28 MED ORDER — ONDANSETRON HCL 4 MG/2ML IJ SOLN
INTRAMUSCULAR | Status: AC
  Filled 2017-07-28: qty 2

## 2017-07-28 MED ORDER — LABETALOL HCL 5 MG/ML IV SOLN
5.0000 mg | INTRAVENOUS | Status: AC
  Administered 2017-07-28: 5 mg via INTRAVENOUS

## 2017-07-28 MED ORDER — NALOXONE HCL 0.4 MG/ML IJ SOLN: 0.4000 mg | INTRAMUSCULAR | Status: DC | PRN

## 2017-07-28 MED ORDER — KETOROLAC TROMETHAMINE 30 MG/ML IJ SOLN
INTRAMUSCULAR | Status: AC
  Filled 2017-07-28: qty 1

## 2017-07-28 MED ORDER — DIPHENHYDRAMINE HCL 50 MG/ML IJ SOLN: 12.5000 mg | INTRAMUSCULAR | Status: DC | PRN

## 2017-07-28 MED ORDER — KETOROLAC TROMETHAMINE 30 MG/ML IJ SOLN: 30.0000 mg | Freq: Four times a day (QID) | INTRAMUSCULAR | Status: AC | PRN

## 2017-07-28 MED ORDER — BUPIVACAINE HCL (PF) 0.5 % IJ SOLN
INTRAMUSCULAR | Status: AC
  Filled 2017-07-28: qty 30

## 2017-07-28 MED ORDER — LIDOCAINE-EPINEPHRINE (PF) 2 %-1:200000 IJ SOLN
INTRAMUSCULAR | Status: AC
  Filled 2017-07-28: qty 20

## 2017-07-28 MED ORDER — NALBUPHINE HCL 10 MG/ML IJ SOLN
5.0000 mg | INTRAMUSCULAR | Status: DC | PRN
  Administered 2017-07-28: 5 mg via INTRAVENOUS
  Filled 2017-07-28: qty 1

## 2017-07-28 MED ORDER — HYDROMORPHONE HCL 1 MG/ML IJ SOLN
INTRAMUSCULAR | Status: AC
  Filled 2017-07-28: qty 1

## 2017-07-28 MED ORDER — FENTANYL CITRATE (PF) 100 MCG/2ML IJ SOLN
INTRAMUSCULAR | Status: AC
  Filled 2017-07-28: qty 4

## 2017-07-28 MED ORDER — MIDAZOLAM HCL 5 MG/5ML IJ SOLN
INTRAMUSCULAR | Status: DC | PRN
  Administered 2017-07-28 (×3): 1 mg via INTRAVENOUS

## 2017-07-28 MED ORDER — ONDANSETRON HCL 4 MG/2ML IJ SOLN
INTRAMUSCULAR | Status: DC | PRN
  Administered 2017-07-28: 4 mg via INTRAVENOUS

## 2017-07-28 MED ORDER — SODIUM CHLORIDE 0.9 % IR SOLN
Status: DC | PRN
  Administered 2017-07-28: 1000 mL

## 2017-07-28 MED ORDER — SODIUM BICARBONATE 8.4 % IV SOLN
INTRAVENOUS | Status: AC
  Filled 2017-07-28: qty 50

## 2017-07-28 MED ORDER — BUPIVACAINE HCL (PF) 0.5 % IJ SOLN
INTRAMUSCULAR | Status: DC | PRN
  Administered 2017-07-28: 20 mL

## 2017-07-28 MED ORDER — LACTATED RINGERS IV SOLN
INTRAVENOUS | Status: DC | PRN
  Administered 2017-07-28 (×2): via INTRAVENOUS

## 2017-07-28 MED ORDER — FENTANYL CITRATE (PF) 100 MCG/2ML IJ SOLN
INTRAMUSCULAR | Status: DC | PRN
  Administered 2017-07-28: 50 ug via INTRAVENOUS
  Administered 2017-07-28: 100 ug via EPIDURAL
  Administered 2017-07-28 (×2): 50 ug via INTRAVENOUS

## 2017-07-28 MED ORDER — FENTANYL CITRATE (PF) 100 MCG/2ML IJ SOLN
INTRAMUSCULAR | Status: AC
  Filled 2017-07-28: qty 2

## 2017-07-28 MED ORDER — LACTATED RINGERS IV SOLN
INTRAVENOUS | Status: DC
  Administered 2017-07-28: 15:00:00 via INTRAVENOUS

## 2017-07-28 MED ORDER — MIDAZOLAM HCL 2 MG/2ML IJ SOLN
INTRAMUSCULAR | Status: AC
  Filled 2017-07-28: qty 2

## 2017-07-28 MED ORDER — NALOXONE HCL 4 MG/10ML IJ SOLN
1.0000 ug/kg/h | INTRAVENOUS | Status: DC | PRN
  Filled 2017-07-28: qty 5

## 2017-07-28 MED ORDER — PROMETHAZINE HCL 25 MG/ML IJ SOLN: 6.2500 mg | INTRAMUSCULAR | Status: DC | PRN

## 2017-07-28 MED ORDER — SCOPOLAMINE 1 MG/3DAYS TD PT72
1.0000 | MEDICATED_PATCH | Freq: Once | TRANSDERMAL | Status: DC
  Administered 2017-07-28: 1.5 mg via TRANSDERMAL
  Filled 2017-07-28 (×2): qty 1

## 2017-07-28 MED ORDER — NALBUPHINE HCL 10 MG/ML IJ SOLN: 5.0000 mg | INTRAMUSCULAR | Status: DC | PRN

## 2017-07-28 MED ORDER — MEPERIDINE HCL 25 MG/ML IJ SOLN: 6.2500 mg | INTRAMUSCULAR | Status: DC | PRN

## 2017-07-28 MED ORDER — KETOROLAC TROMETHAMINE 30 MG/ML IJ SOLN
30.0000 mg | Freq: Four times a day (QID) | INTRAMUSCULAR | Status: AC | PRN
  Administered 2017-07-28: 30 mg via INTRAMUSCULAR

## 2017-07-28 MED ORDER — SODIUM CHLORIDE 0.9% FLUSH: 3.0000 mL | INTRAVENOUS | Status: DC | PRN

## 2017-07-28 MED ORDER — HYDROMORPHONE HCL 1 MG/ML IJ SOLN
0.2500 mg | INTRAMUSCULAR | Status: DC | PRN
  Administered 2017-07-28: 0.5 mg via INTRAVENOUS
  Administered 2017-07-28: 0.25 mg via INTRAVENOUS

## 2017-07-28 MED ORDER — ONDANSETRON HCL 4 MG/2ML IJ SOLN: 4.0000 mg | Freq: Three times a day (TID) | INTRAMUSCULAR | Status: DC | PRN

## 2017-07-28 MED ORDER — DIPHENHYDRAMINE HCL 25 MG PO CAPS: 25.0000 mg | ORAL_CAPSULE | ORAL | Status: DC | PRN

## 2017-07-28 MED ORDER — LABETALOL HCL 5 MG/ML IV SOLN
INTRAVENOUS | Status: AC
  Administered 2017-07-28: 5 mg
  Filled 2017-07-28: qty 4

## 2017-07-28 SURGICAL SUPPLY — 27 items
CHLORAPREP W/TINT 26ML (MISCELLANEOUS) ×3 IMPLANT
CLOTH BEACON ORANGE TIMEOUT ST (SAFETY) ×3 IMPLANT
DRSG OPSITE POSTOP 3X4 (GAUZE/BANDAGES/DRESSINGS) ×3 IMPLANT
GLOVE BIOGEL PI IND STRL 6.5 (GLOVE) ×1 IMPLANT
GLOVE BIOGEL PI IND STRL 7.0 (GLOVE) ×1 IMPLANT
GLOVE BIOGEL PI INDICATOR 6.5 (GLOVE) ×2
GLOVE BIOGEL PI INDICATOR 7.0 (GLOVE) ×2
GLOVE ORTHOPEDIC STR SZ6.5 (GLOVE) ×3 IMPLANT
GOWN STRL REUS W/TWL LRG LVL3 (GOWN DISPOSABLE) ×6 IMPLANT
HEMOSTAT ARISTA ABSORB 3G PWDR (MISCELLANEOUS) ×3 IMPLANT
NEEDLE HYPO 22GX1.5 SAFETY (NEEDLE) ×3 IMPLANT
NS IRRIG 1000ML POUR BTL (IV SOLUTION) ×3 IMPLANT
PACK ABDOMINAL MINOR (CUSTOM PROCEDURE TRAY) ×3 IMPLANT
SPONGE LAP 4X18 RFD (DISPOSABLE) ×3 IMPLANT
SPONGE LAP 4X18 X RAY DECT (DISPOSABLE) ×3 IMPLANT
SUT MON AB 4-0 PS1 27 (SUTURE) ×3 IMPLANT
SUT PLAIN 0 NONE (SUTURE) ×6 IMPLANT
SUT PLAIN 2 0 (SUTURE) ×2
SUT PLAIN ABS 2-0 CT1 27XMFL (SUTURE) ×1 IMPLANT
SUT VICRYL 0 UR6 27IN ABS (SUTURE) ×3 IMPLANT
SYR CONTROL 10ML LL (SYRINGE) ×3 IMPLANT
TOWEL OR 17X24 6PK STRL BLUE (TOWEL DISPOSABLE) ×6 IMPLANT
TRAY FOLEY CATH SILVER 14FR (SET/KITS/TRAYS/PACK) ×3 IMPLANT
TUBING NON-CON 1/4 X 20 CONN (TUBING) ×2 IMPLANT
TUBING NON-CON 1/4 X 20' CONN (TUBING) ×1
WATER STERILE IRR 1000ML POUR (IV SOLUTION) ×3 IMPLANT
YANKAUER SUCT BULB TIP NO VENT (SUCTIONS) ×3 IMPLANT

## 2017-07-28 NOTE — Plan of Care (Signed)
Patient is resting in bed at this time. Denies any pain at this time. Encouraged Patient to order food from the cafeteria. Patient verbalizes an understanding

## 2017-07-28 NOTE — Progress Notes (Signed)
Dr. Vergie LivingPickens made aware of current blood pressure reading.

## 2017-07-28 NOTE — Addendum Note (Signed)
Addendum  created 07/28/17 1726 by Elgie Congo, CRNA   Sign clinical note

## 2017-07-28 NOTE — Progress Notes (Addendum)
error 

## 2017-07-28 NOTE — Anesthesia Postprocedure Evaluation (Signed)
Anesthesia Post Note  Patient: Sandra Olson  Procedure(s) Performed: POST PARTUM TUBAL LIGATION (Bilateral Abdomen)     Patient location during evaluation: [facility] Anesthesia Type: Epidural Level of consciousness: awake and alert Pain management: pain level controlled Vital Signs Assessment: post-procedure vital signs reviewed and stable Respiratory status: spontaneous breathing, nonlabored ventilation and respiratory function stable Cardiovascular status: stable Postop Assessment: no headache, no backache, epidural receding, no apparent nausea or vomiting, patient able to bend at knees, able to ambulate and adequate PO intake Anesthetic complications: no    Last Vitals:  Vitals:   07/28/17 1230 07/28/17 1600  BP: (!) 152/89 (!) 140/96  Pulse: 85 85  Resp: 18 18  Temp:  36.6 C  SpO2: 98%     Last Pain:  Vitals:   07/28/17 1618  TempSrc:   PainSc: 0-No pain   Pain Goal:                 [employer]

## 2017-07-28 NOTE — Progress Notes (Signed)
CSW acknowledges consult.  CSW attempted to meet with MOB, however MOB was in surgery.  CSW requested bedside nurse to contact CSW when MOB returns to her room.  CSW will attempt to visit with MOB at a later time.   Blaine HamperAngel Boyd-Gilyard, MSW, LCSW Clinical Social Work [phone]

## 2017-07-28 NOTE — Op Note (Signed)
   Sandra BisonVannysa L Olson 07/26/2017 - 07/28/2017  PREOPERATIVE DIAGNOSES: Multiparity, undesired fertility  POSTOPERATIVE DIAGNOSES: Multiparity, undesired fertility  PROCEDURE:  Postpartum Bilateral Tubal Sterilization via Modified Pomeroy fashion   SURGEON: Baldemar LenisK. Meryl Ketzaly Cardella, MD  ASSISTANT: Caryl AdaJazma Phelps, MD  ANESTHESIA:  Epidural and local analgesia using 20 ml of 0.025% Marcaine  COMPLICATIONS:  None immediate.  ESTIMATED BLOOD LOSS: 15 ml.  FLUIDS: 1000 ml LR.  URINE OUTPUT:  100 ml of clear urine.  INDICATIONS:  27 y.o. [id number] with undesired fertility,status post vaginal delivery, desires permanent sterilization.  Other reversible forms of contraception were discussed with patient; she declines all other modalities. Risks of procedure discussed with patient including but not limited to: risk of regret, permanence of method, bleeding, infection, injury to surrounding organs and need for additional procedures.  Failure risk of 1 -2 % with increased risk of ectopic gestation if pregnancy occurs was also discussed with patient.      FINDINGS:  Normal uterus, tubes, and ovaries. Left tube abnormality short.  PROCEDURE DETAILS: The patient was taken to the operating room where her epidural anesthesia was dosed up to surgical level and found to be adequate.  She was then placed in the dorsal supine position and prepped and draped in sterile fashion.  After an adequate timeout was performed, attention was turned to the patient's abdomen where a 4 cm transverse skin incision was made inferior to the umbilicus. The incision was taken down to the layer of fascia using the scalpel, and fascia was incised, and extended bilaterally using Mayo scissors. The peritoneum was entered sharply and the uterus and tubes were visualized.     Attention was then turned to the patient's uterus, and the left fallopian tube was clamped using a babcock and followed out to its fimbriated end. A 3 cm portion of the  mid-portion of the fallopian tube was grasped with babcock graspers and elevated. The elevated loop of tube was tied off with 2-0 plain gut. A second tie was placed under the first tie and a 2 cm loop of tube was excised by bluntly creating a hole in the mesosalpinx under the tied off portion above the suture and excising each end of tube with the metzenbaum scissors via a modified pomeroy fashion.  The remaining pedicles of the tube were inspected and found to be hemostatic. The right fallopian tube was visualized and followed out to the fimbriated end.  A similar process was carried out for the right fallopian tube and hemostasis noted at the pedicles. The left tube was again visualized and the knot appeared to have slipped from the left cornual pedicle, another free tie was placed over the pedicle with 2-0 plain gut. Good hemostasis noted, Arista placed for additional hemostasis. The instruments were then removed from the patient's abdomen and the fascial incision was repaired with 0 Vicryl, the subcutaneous tissue was closed with 2-0 plain and the skin was closed with a 4-0 Monocryl subcuticular stitch. Marcaine 0.025% was injected around the incision. The patient tolerated the procedure well.  Instrument, sponge, and needle counts were correct times two.  The patient was then taken to the recovery room awake and in stable condition.   Baldemar LenisK. Meryl Sandra Olson, M.D. Attending Obstetrician & Gynecologist, [facility] Center for [employer] [facility], [facility]

## 2017-07-28 NOTE — Anesthesia Postprocedure Evaluation (Signed)
Anesthesia Post Note  Patient: Sandra Olson  Procedure(s) Performed: POST PARTUM TUBAL LIGATION (Bilateral Abdomen)     Patient location during evaluation: PACU Anesthesia Type: Epidural Level of consciousness: oriented and awake and alert Pain management: pain level controlled Vital Signs Assessment: post-procedure vital signs reviewed and stable Respiratory status: spontaneous breathing, respiratory function stable and patient connected to nasal cannula oxygen Cardiovascular status: blood pressure returned to baseline and stable Postop Assessment: no headache, no backache and no apparent nausea or vomiting Anesthetic complications: no    Last Vitals:  Vitals:   07/28/17 1153 07/28/17 1230  BP: (!) 165/93 (!) 152/89  Pulse: 76 85  Resp: 18 18  Temp: 36.8 C   SpO2: 98% 98%    Last Pain:  Vitals:   07/28/17 1153  TempSrc: Oral  PainSc: 2    Pain Goal:                 Shelton SilvasKevin D Hollis

## 2017-07-28 NOTE — Progress Notes (Signed)
Faculty Note  Patient is a 27 y.o. [id number] who is PPD#2 (del at 7 on 5/29) s/p SVD after IOL for gDMA2 and subsequent pre-eclampsia with severe features s/p 24 hrs MgSO4.   In to see patient, she again affirms desire to have bilateral tubal ligation done. She understands that this is a permanent procedure and she will not be able to have children after it is done. Reviewed risks of bilateral tubal ligation including infection, hemorrhage, damage to surrounding tissue and organs, risk of regret. Reviewed that bilateral tubal ligation is not 100% effective and she should take a pregnancy test if she believes for any reason she may be pregnant. Reviewed slightly increased risk of ectopic pregnancy and need to seek care if she becomes pregnant. She understands this is an elective procedure and again affirms her desire. Consent signed, also consents to blood transfusion if necessary.   She is NPO To OR when ready   K. Therese Sarah, M.D. Attending Obstetrician & Gynecologist, [facility] for [employer], [facility]

## 2017-07-28 NOTE — Progress Notes (Signed)
Daily Postpartum Note  Admission Date: 07/26/2017 Current Date: 07/28/2017 1:14 PM  Sandra Olson is a 27 y.o. [id number] PPD#2 (delivered just before MN)SVD/intact perineum and POD#0 s/p BTL (pomeroy)  Patient admitted for GDMA2 IOL and dx with severe pre-x (BP)  Pregnancy complicated by: Patient Active Problem List   Diagnosis Date Noted  . Severe preeclampsia, third trimester 07/27/2017  . Abnormal glucose tolerance test (GTT) during pregnancy, antepartum 06/30/2017  . Gestational diabetes mellitus, class A2 06/14/2017  . Supervision of high risk pregnancy, antepartum 05/08/2017  . Late prenatal care in third trimester 05/08/2017  . Depression 03/27/2017  . Sickle cell trait (HCC) 01/14/2015  . History of gestational hypertension 01/14/2015    Overnight/24hr events:  none  Subjective:  Pt sore and has some nausea since surgery. No anorexia, s/s of pre-x  Objective:    Current Vital Signs 24h Vital Sign Ranges  T 98.2 F (36.8 C) Temp  Avg: 98.5 F (36.9 C)  Min: 97.8 F (36.6 C)  Max: 99.4 F (37.4 C)  BP (!) 152/89 BP  Min: 129/70  Max: 167/97  HR 85 Pulse  Avg: 76.1  Min: 64  Max: 88  RR 18 Resp  Avg: 19.1  Min: 18  Max: 22  SaO2 98 % Room Air SpO2  Avg: 98.1 %  Min: 97 %  Max: 100 %       24 Hour I/O Current Shift I/O  Time Ins Outs 05/30 0701 - 05/31 0700 In: 4815 [P.O.:3540; I.V.:1275] Out: 1900 [Urine:1900] 05/31 0701 - 05/31 1900 In: 1625 [I.V.:1625] Out: 240 [Urine:225]   Patient Vitals for the past 24 hrs:  BP Temp Temp src Pulse Resp SpO2  07/28/17 1230 (!) 152/89 - - 85 18 98 %  07/28/17 1153 (!) 165/93 98.2 F (36.8 C) Oral 76 18 98 %  07/28/17 1115 (!) 162/90 98.4 F (36.9 C) Oral 77 20 97 %  07/28/17 1045 (!) 149/96 - - 76 (!) 22 97 %  07/28/17 1030 (!) 161/92 - - 74 20 97 %  07/28/17 1015 (!) 167/97 - - 72 (!) 22 98 %  07/28/17 1000 (!) 150/100 - - 75 20 97 %  07/28/17 0945 (!) 165/98 - - 76 18 99 %  07/28/17 0930 (!) 157/88 - - 74 20 98  %  07/28/17 0915 (!) 160/94 97.9 F (36.6 C) Oral 88 18 98 %  07/28/17 0732 (!) 142/96 99.3 F (37.4 C) Oral 76 18 98 %  07/28/17 0405 (!) 142/90 98.4 F (36.9 C) - 70 18 99 %  07/27/17 2347 (!) 140/92 98.4 F (36.9 C) - 86 20 99 %  07/27/17 2100 - - - - 18 -  07/27/17 1940 (!) 151/82 97.8 F (36.6 C) - 64 18 100 %  07/27/17 1624 129/70 99.4 F (37.4 C) Oral 72 18 99 %    Physical exam: General: Well nourished, well developed female in no acute distress. Abdomen: c/d/i dressing in the below the umbilicus. Rare BS, moderately distended, appropriately ttp Cardiovascular: S1, S2 normal, no murmur, rub or gallop, regular rate and rhythm Respiratory: CTAB Extremities: no clubbing, cyanosis or edema Skin: Warm and dry.   Medications: Current Facility-Administered Medications  Medication Dose Route Frequency Provider Last Rate Last Dose  . acetaminophen (TYLENOL) tablet 650 mg  650 mg Oral Q4H PRN Arabella Merles, CNM      . benzocaine-Menthol (DERMOPLAST) 20-0.5 % topical spray 1 application  1 application Topical PRN Arabella Merles,  CNM      . coconut oil  1 application Topical PRN Arabella Merles, CNM   1 application at 07/27/17 2117  . witch hazel-glycerin (TUCKS) pad 1 application  1 application Topical PRN Arabella Merles, CNM       And  . dibucaine (NUPERCAINAL) 1 % rectal ointment 1 application  1 application Rectal PRN Arabella Merles, CNM      . diphenhydrAMINE (BENADRYL) capsule 25 mg  25 mg Oral Q6H PRN Cam Hai D, CNM   25 mg at 07/27/17 1741  . diphenhydrAMINE (BENADRYL) injection 12.5 mg  12.5 mg Intravenous Q4H PRN Shelton Silvas, MD       Or  . diphenhydrAMINE (BENADRYL) capsule 25 mg  25 mg Oral Q4H PRN Shelton Silvas, MD      . HYDROmorphone (DILAUDID) 1 MG/ML injection           . HYDROmorphone (DILAUDID) injection 0.25-0.5 mg  0.25-0.5 mg Intravenous Q5 min PRN Shelton Silvas, MD   0.5 mg at 07/28/17 1031  . ibuprofen (ADVIL,MOTRIN) tablet 600 mg   600 mg Oral Q6H Cam Hai D, CNM   600 mg at 07/28/17 1610  . ketorolac (TORADOL) 30 MG/ML injection 30 mg  30 mg Intravenous Q6H PRN Shelton Silvas, MD       Or  . ketorolac (TORADOL) 30 MG/ML injection 30 mg  30 mg Intramuscular Q6H PRN Shelton Silvas, MD   30 mg at 07/28/17 0950  . ketorolac (TORADOL) 30 MG/ML injection           . lactated ringers infusion   Intravenous Continuous Shelton Silvas, MD      . meperidine (DEMEROL) injection 6.25 mg  6.25 mg Intravenous Q5 min PRN Shelton Silvas, MD      . meperidine (DEMEROL) injection 6.25-12.5 mg  6.25-12.5 mg Intravenous Q5 min PRN Shelton Silvas, MD      . nalbuphine (NUBAIN) injection 5 mg  5 mg Intravenous Q4H PRN Shelton Silvas, MD       Or  . nalbuphine (NUBAIN) injection 5 mg  5 mg Subcutaneous Q4H PRN Shelton Silvas, MD      . nalbuphine (NUBAIN) injection 5 mg  5 mg Intravenous Once PRN Shelton Silvas, MD       Or  . nalbuphine (NUBAIN) injection 5 mg  5 mg Subcutaneous Once PRN Shelton Silvas, MD      . naloxone [facility]) injection 0.4 mg  0.4 mg Intravenous PRN Shelton Silvas, MD       And  . sodium chloride flush (NS) 0.9 % injection 3 mL  3 mL Intravenous PRN Shelton Silvas, MD      . naloxone HCl [facility]) 2 mg in dextrose 5 % 250 mL infusion  1-4 mcg/kg/hr Intravenous Continuous PRN Shelton Silvas, MD      . NIFEdipine (PROCARDIA-XL/ADALAT-CC/NIFEDICAL-XL) 24 hr tablet 30 mg  30 mg Oral Daily [place]lake Bing, MD   30 mg at 07/28/17 1158  . ondansetron (ZOFRAN) tablet 4 mg  4 mg Oral Q4H PRN Arabella Merles, CNM       Or  . ondansetron [facility]) injection 4 mg  4 mg Intravenous Q4H PRN Cam Hai D, CNM   4 mg at 07/28/17 1140  . ondansetron (ZOFRAN) injection 4 mg  4 mg Intravenous Q8H PRN Shelton Silvas, MD      . oxyCODONE (Oxy IR/ROXICODONE) immediate release  tablet 5 mg  5 mg Oral Q4H PRN Arabella Merles, CNM   5 mg at 07/27/17 2116  . prenatal multivitamin tablet 1 tablet  1 tablet Oral Q1200 Cam Hai D, CNM   1 tablet at 07/27/17 1120  . promethazine (PHENERGAN) injection 6.25-12.5 mg  6.25-12.5 mg Intravenous Q15 min PRN Shelton Silvas, MD      . scopolamine (TRANSDERM-SCOP) 1 MG/3DAYS 1.5 mg  1 patch Transdermal Once Shelton Silvas, MD   1.5 mg at 07/28/17 1208  . senna-docusate (Senokot-S) tablet 2 tablet  2 tablet Oral QHS PRN Maiden Bing, MD      . simethicone (MYLICON) chewable tablet 80 mg  80 mg Oral PRN Arabella Merles, CNM      . Tdap (BOOSTRIX) injection 0.5 mL  0.5 mL Intramuscular Once Arabella Merles, CNM        Labs:  Recent Labs  Lab 07/26/17 1040 07/26/17 1940 07/28/17 1100  WBC 7.6 8.1 8.4  HGB 10.8* 10.6* 9.5*  HCT 33.2* 33.3* 29.9*  PLT 318 316 290    Recent Labs  Lab 07/26/17 1040 07/26/17 1940  NA 136 135  K 3.9 3.6  CL 104 103  CO2 21* 20*  BUN <5* <5*  CREATININE 0.43* 0.43*  CALCIUM 9.5 9.1  PROT 6.7 6.8  BILITOT 1.0 0.9  ALKPHOS 144* 145*  ALT 25 26  AST 44* 46*  GLUCOSE 90 105*     Radiology: no new imaging  Assessment & Plan:  Pt stable *PP: routine care. A pos. S/p BTL (epidural used). Continue to follow to see if post op s/s improve *Severe pre-eclampsia: s/p 24h of ppMg. Pt didn't get her AM procardia until noon due to surgery. Recommend watching her today and likely d/c to home tomorrow GDMA2: s/p normal AM fastings BS values x 2. No need for further checks.  *PPx: SCDs *FEN/GI: regular diet. SLIV *Dispo: likely tomorrow morning. Has bp check for next week already.   Cornelia Copa MD Attending Center for [facility])

## 2017-07-28 NOTE — Progress Notes (Signed)
0933 CBG in PACU 84

## 2017-07-28 NOTE — Transfer of Care (Signed)
Immediate Anesthesia Transfer of Care Note  Patient: Sandra Olson  Procedure(s) Performed: POST PARTUM TUBAL LIGATION (Bilateral Abdomen)  Patient Location: PACU  Anesthesia Type:Epidural  Level of Consciousness: awake, alert  and oriented  Airway & Oxygen Therapy: Patient Spontanous Breathing  Post-op Assessment: Report given to RN and Post -op Vital signs reviewed and stable  Post vital signs: Reviewed and stable  Last Vitals:  Vitals Value Taken Time  BP 160/94 07/28/2017  9:14 AM  Temp    Pulse 88 07/28/2017  9:15 AM  Resp 18 07/28/2017  9:15 AM  SpO2 98 % 07/28/2017  9:15 AM  Vitals shown include unvalidated device data.  Last Pain:  Vitals:   07/28/17 0732  TempSrc: Oral  PainSc:          Complications: No apparent anesthesia complications

## 2017-07-28 NOTE — Clinical Social Work Maternal (Signed)
CLINICAL SOCIAL WORK MATERNAL/CHILD NOTE  Patient Details  Name: Sandra Olson MRN: [id number] Date of Birth: 08/29/1990  Date:  01-25-18  Clinical Social Worker Initiating Note:  Laurey Arrow Date/Time: Initiated:  07/28/17/1138     Child's Name:  Janina Mayo   Biological Parents:  Mother   Need for Interpreter:  None   Reason for Referral:  Behavioral Health Concerns, Late or No Prenatal Care    Address:  [address] [place] [address]    Phone number:  [phone] (home)     Additional phone number:   Household Members/Support Persons (HM/SP):   Household Member/Support Person 1, Household Member/Support Person 2, Household Member/Support Person 3, Household Member/Support Person 4   HM/SP Name Relationship DOB or Age  HM/SP -1 Daffney Greenly FOB 03/23/1986  HM/SP -2 Loren Racer son 12/13/08  HM/SP -3 Peyson Delao daughter 05/13/13  HM/SP -4 Kissa Campoy son 06/01/15  HM/SP -5        HM/SP -6        HM/SP -7        HM/SP -8          Natural Supports (not living in the home):  Immediate Family, Parent, Extended Family   Professional Supports: None   Employment: Unemployed   Type of Work:     Education:  Programmer, systems   Homebound arranged:    Museum/gallery curator Resources:  [employer]   Other Resources:  Physicist, medical (MOB also plans to apply for [facility] after discharging. )   Cultural/Religious Considerations Which May Impact Care:  None Reported  Strengths:  Ability to meet basic needs , Home prepared for child , Pediatrician chosen   Psychotropic Medications:         Pediatrician:    [facility]  Pediatrician List:   [place]      Pediatrician Fax Number:    Risk Factors/Current Problems:  Mental Health Concerns , Transportation    Cognitive State:  Able to Concentrate , Alert , Insightful , Linear Thinking     Mood/Affect:  Relaxed , Happy , Comfortable , Interested , Bright    CSW Assessment: CSW met with MOB to complete an assessment for late [facility] and MH hx.  When CSW arrived, MOB had several room guest.  With MOB's permission, CSW asked MOB's guest to leave the room in order to meet MOB in private.  MOB was polite and easy to engage.  Throughout the assessment, MOB was attentive to infant and responded appropriately to infant's cues throughout the assessment.   CSW asked about MOB's MH and MOB acknowledged a hx of depression. MOB also reported having PPD signs and symtoms with MOB's older and shared that MOB did not seek any help or inform MOB's OB providers. CSW provided education regarding the baby blues period vs. perinatal mood disorders, discussed treatment and gave resources for mental health follow up if concerns arise.  CSW recommends self-evaluation during the postpartum time period using the New Mom Checklist from Postpartum Progress and encouraged MOB to contact a medical professional if symptoms are noted at any time. MOB did not present with any acute MH symptoms and was receptive to resources for outpatient counseling. CSW assessed for safety and MOB denied SI, HI, and DV.   CSW asked about MOB's late prenatal care.  MOB shared that  the family had transportation but issue has since resolved.  MOB reported having reliable transportation and denied barriers to follow-up for infant and herself. CSW explained hospital's drug screen policy and MOB was understanding.  CSW made MOB aware that infant's UDS and CDS are pending and if they positive without an explanation, CSW will make a report to [facility] CPS. MOB denied the use of all illicit substance.     MOB reported having all necessary items for infant and feeling prepared to parent.   CSW Plan/Description:  No Further Intervention Required/No Barriers to Discharge, Sudden Infant Death Syndrome (SIDS) Education, Other  Information/Referral to [facility], CSW Will Continue to Monitor Umbilical Cord Tissue Drug Screen Results and Make Report if Warranted, Perinatal Mood and Anxiety Disorder (PMADs) Education   Laurey Arrow, MSW, LCSW Clinical Social Work [phone]   Dimple Nanas, [place] 07/28/2017, 2:00 PM

## 2017-07-29 MED ORDER — OXYCODONE HCL 5 MG PO TABS: 5.0000 mg | ORAL_TABLET | ORAL | 0 refills | Status: AC | PRN

## 2017-07-29 MED ORDER — IBUPROFEN 600 MG PO TABS: 600.0000 mg | ORAL_TABLET | Freq: Four times a day (QID) | ORAL | 0 refills | Status: AC | PRN

## 2017-07-29 MED ORDER — NIFEDIPINE ER 30 MG PO TB24: 30.0000 mg | ORAL_TABLET | Freq: Every day | ORAL | 1 refills | Status: AC

## 2017-07-29 NOTE — Discharge Summary (Signed)
OB Discharge Summary     Patient Name: Sandra Olson DOB: 11-Apr-1990 MRN: [id number]  Date of admission: 07/26/2017 Delivering MD: Cam Hai D   Date of discharge: 07/29/2017  Admitting diagnosis: INDUCTION Intrauterine pregnancy: [redacted]w[redacted]d     Secondary diagnosis:  Active Problems:   Sickle cell trait (HCC)   Depression   Supervision of high risk pregnancy, antepartum   Late prenatal care in third trimester   Gestational diabetes mellitus, class A2   Severe preeclampsia, third trimester  Additional problems: desires sterilization; GBS neg     Discharge diagnosis: Term Pregnancy Delivered, Preeclampsia (severe) and GDM A2                                                                                                Post partum procedures:none  Augmentation: Pitocin, Cytotec and Foley Balloon  Complications: None  Hospital course:  Induction of Labor With Vaginal Delivery   27 y.o. yo [id number] at [redacted]w[redacted]d was admitted to the hospital 07/26/2017 for induction of labor.  Indication for induction: A2 DM. She was noted to have elevated BPs on admission that became in severe range- she was given a dx of severe pre-e and was started on mag sulfate therapy. Labs: P/C ratio was nl, AST was sl elevated at 46.  Membrane Rupture Time/Date: 9:40 PM ,07/26/2017   Intrapartum Procedures: Episiotomy: None [1]                                         Lacerations:  None [1]  Patient had delivery of a Viable infant.  Information for the patient's newborn:  Masaye, Gatchalian [[id number]]  Delivery Method: Vag-Spont   07/26/2017  Details of delivery can be found in separate delivery note.  Patient had a postpartum course significant for receiving mag sulfate x 24h PP and being stated on Procardia 30 XL on PPD#0 for BP management. BTL was performed on PPD#1 and she tolerated it well. PP CBGs within nl range. Patient is discharged home 07/29/17.  Physical exam  Vitals:   07/28/17 2053 07/29/17  0014 07/29/17 0438 07/29/17 0713  BP: 136/85 127/68 134/75 131/82  Pulse: 89 79 72 68  Resp: 18 17 16 16   Temp: 98.6 F (37 C) 99.6 F (37.6 C) 99 F (37.2 C) 98.9 F (37.2 C)  TempSrc: Oral Oral Oral Oral  SpO2: 100% 100% 97% 97%  Weight:      Height:       General: alert and cooperative Lochia: appropriate Uterine Fundus: firm Incision: umb incision dry, intact with honeycomb DVT Evaluation: No evidence of DVT seen on physical exam. Labs: Lab Results  Component Value Date   WBC 8.4 07/28/2017   HGB 9.5 (L) 07/28/2017   HCT 29.9 (L) 07/28/2017   MCV 72.7 (L) 07/28/2017   PLT 290 07/28/2017   CMP Latest Ref Rng & Units 07/26/2017  Glucose 65 - 99 mg/dL 962(X)  BUN 6 - 20 mg/dL <5(M)  Creatinine 8.41 - 1.00 mg/dL  0.43(L)  Sodium 135 - 145 mmol/L 135  Potassium 3.5 - 5.1 mmol/L 3.6  Chloride 101 - 111 mmol/L 103  CO2 22 - 32 mmol/L 20(L)  Calcium 8.9 - 10.3 mg/dL 9.1  Total Protein 6.5 - 8.1 g/dL 6.8  Total Bilirubin 0.3 - 1.2 mg/dL 0.9  Alkaline Phos 38 - 126 U/L 145(H)  AST 15 - 41 U/L 46(H)  ALT 14 - 54 U/L 26    Discharge instruction: per After Visit Summary and "Baby and Me Booklet".  After visit meds:  Allergies as of 07/29/2017      Reactions   Codeine Hives      Medication List    STOP taking these medications   diphenhydrAMINE 25 MG tablet Commonly known as:  BENADRYL   metFORMIN 1000 MG tablet Commonly known as:  GLUCOPHAGE   TYLENOL PO     TAKE these medications   ferrous sulfate 325 (65 FE) MG tablet Take 1 tablet (325 mg total) by mouth 2 (two) times daily with a meal.   ibuprofen 600 MG tablet Commonly known as:  ADVIL,MOTRIN Take 1 tablet (600 mg total) by mouth every 6 (six) hours as needed.   NIFEdipine 30 MG 24 hr tablet Commonly known as:  PROCARDIA-XL/ADALAT CC Take 1 tablet (30 mg total) by mouth daily.   oxyCODONE 5 MG immediate release tablet Commonly known as:  Oxy IR/ROXICODONE Take 1 tablet (5 mg total) by mouth every  4 (four) hours as needed (pain scale 4-7).   prenatal vitamin w/FE, FA 29-1 MG Chew chewable tablet Take 1 tablet po daily What changed:    how much to take  how to take this  when to take this  additional instructions       Diet: routine diet  Activity: Advance as tolerated. Pelvic rest for 6 weeks.   Outpatient follow up:1 wk for BP check, then 6 wk PP visit with GTT Follow up Appt: Future Appointments  Date Time Provider Department Center  08/03/2017  2:00 PM [facility] NURSE TECH [facility]  09/01/2017 11:30 AM Cheral MarkerBooker, Shaconda Hajduk R, CNM [facility]   Follow up Visit:No follow-ups on file.  Postpartum contraception: Tubal Ligation  Newborn Data: Live born female  Birth Weight: 7 lb 14.8 oz (3595 g) APGAR: 6, 9  Newborn Delivery   Birth date/time:  07/26/2017 23:56:00 Delivery type:  Vaginal, Spontaneous     Baby Feeding: Breast Disposition:home with mother   07/29/2017 Cam HaiSHAW, Gaylynn Seiple, CNM  8:44 AM

## 2017-07-29 NOTE — Progress Notes (Signed)
All discharge teaching completed with the patient. All printed discharge instructions, including prescription, given to the patient. Patient verbalizes an understanding of all instructions given and denies any questions or concerns.

## 2017-07-29 NOTE — Lactation Note (Signed)
This note was copied from a baby's chart. Lactation Consultation Note  Patient Name: Sandra Olson ZOXWR'UToday's Date: 07/29/2017 Reason for consult: Follow-up assessment  1160 hours old female who is being partially BF and formula fed by her mother, she's a P4. Lactogenesis II has already started, per RN baby kept having long nursing sessions last night and mom's milk just poured in when she was in the shower. Discussed engorgement prevention and treatment. Requested some ice packs to her RN for mom to take home. Mom is not engorged yet, but she's aware that if she starts skipping feedings she may get engorged. Reviewed discharge instructions and when to call baby's pediatrician. Mom is aware of LC services and will call PRN.  Maternal Data    Feeding      Interventions Interventions: Breast feeding basics reviewed;Ice  Lactation Tools Discussed/Used     Consult Status Consult Status: Complete Date: 07/29/17 Follow-up type: Call as needed    Susumu Hackler Venetia ConstableS Cody Albus 07/29/2017, 11:56 AM

## 2017-07-29 NOTE — Discharge Instructions (Signed)
Laparoscopic Tubal Ligation, Care After °Refer to this sheet in the next few weeks. These instructions provide you with information about caring for yourself after your procedure. Your health care provider may also give you more specific instructions. Your treatment has been planned according to current medical practices, but problems sometimes occur. Call your health care provider if you have any problems or questions after your procedure. °What can I expect after the procedure? °After the procedure, it is common to have: °· A sore throat. °· Discomfort in your shoulder. °· Mild discomfort or cramping in your abdomen. °· Gas pains. °· Pain or soreness in the area where the surgical cut (incision) was made. °· A bloated feeling. °· Tiredness. °· Nausea. °· Vomiting. ° °Follow these instructions at home: °Medicines °· Take over-the-counter and prescription medicines only as told by your health care provider. °· Do not take aspirin because it can cause bleeding. °· Do not drive or operate heavy machinery while taking prescription pain medicine. °Activity °· Rest for the rest of the day. °· Return to your normal activities as told by your health care provider. Ask your health care provider what activities are safe for you. °Incision care ° °· Follow instructions from your health care provider about how to take care of your incision. Make sure you: °? Wash your hands with soap and water before you change your bandage (dressing). If soap and water are not available, use hand sanitizer. °? Change your dressing as told by your health care provider. °? Leave stitches (sutures) in place. They may need to stay in place for 2 weeks or longer. °· Check your incision area every day for signs of infection. Check for: °? More redness, swelling, or pain. °? More fluid or blood. °? Warmth. °? Pus or a bad smell. °Other Instructions °· Do not take baths, swim, or use a hot tub until your health care provider approves. You may take  showers. °· Keep all follow-up visits as told by your health care provider. This is important. °· Have someone help you with your daily household tasks for the first few days. °Contact a health care provider if: °· You have more redness, swelling, or pain around your incision. °· Your incision feels warm to the touch. °· You have pus or a bad smell coming from your incision. °· The edges of your incision break open after the sutures have been removed. °· Your pain does not improve after 2-3 days. °· You have a rash. °· You repeatedly become dizzy or light-headed. °· Your pain medicine is not helping. °· You are constipated. °Get help right away if: °· You have a fever. °· You faint. °· You have increasing pain in your abdomen. °· You have severe pain in one or both of your shoulders. °· You have fluid or blood coming from your sutures or from your vagina. °· You have shortness of breath or difficulty breathing. °· You have chest pain or leg pain. °· You have ongoing nausea, vomiting, or diarrhea. °This information is not intended to replace advice given to you by your health care provider. Make sure you discuss any questions you have with your health care provider. °Document Released: 09/03/2004 Document Revised: 07/20/2015 Document Reviewed: 01/25/2015 °Elsevier Interactive Patient Education © 2018 Elsevier Inc. °Vaginal Delivery, Care After °Refer to this sheet in the next few weeks. These instructions provide you with information about caring for yourself after vaginal delivery. Your health care provider may also give you more   specific instructions. Your treatment has been planned according to current medical practices, but problems sometimes occur. Call your health care provider if you have any problems or questions. What can I expect after the procedure? After vaginal delivery, it is common to have:  Some bleeding from your vagina.  Soreness in your abdomen, your vagina, and the area of skin between your  vaginal opening and your anus (perineum).  Pelvic cramps.  Fatigue.  Follow these instructions at home: Medicines  Take over-the-counter and prescription medicines only as told by your health care provider.  If you were prescribed an antibiotic medicine, take it as told by your health care provider. Do not stop taking the antibiotic until it is finished. Driving   Do not drive or operate heavy machinery while taking prescription pain medicine.  Do not drive for 24 hours if you received a sedative. Lifestyle  Do not drink alcohol. This is especially important if you are breastfeeding or taking medicine to relieve pain.  Do not use tobacco products, including cigarettes, chewing tobacco, or e-cigarettes. If you need help quitting, ask your health care provider. Eating and drinking  Drink at least 8 eight-ounce glasses of water every day unless you are told not to by your health care provider. If you choose to breastfeed your baby, you may need to drink more water than this.  Eat high-fiber foods every day. These foods may help prevent or relieve constipation. High-fiber foods include: ? Whole grain cereals and breads. ? Brown rice. ? Beans. ? Fresh fruits and vegetables. Activity  Return to your normal activities as told by your health care provider. Ask your health care provider what activities are safe for you.  Rest as much as possible. Try to rest or take a nap when your baby is sleeping.  Do not lift anything that is heavier than your baby or 10 lb (4.5 kg) until your health care provider says that it is safe.  Talk with your health care provider about when you can engage in sexual activity. This may depend on your: ? Risk of infection. ? Rate of healing. ? Comfort and desire to engage in sexual activity. Vaginal Care  If you have an episiotomy or a vaginal tear, check the area every day for signs of infection. Check for: ? More redness, swelling, or pain. ? More  fluid or blood. ? Warmth. ? Pus or a bad smell.  Do not use tampons or douches until your health care provider says this is safe.  Watch for any blood clots that may pass from your vagina. These may look like clumps of dark red, brown, or black discharge. General instructions  Keep your perineum clean and dry as told by your health care provider.  Wear loose, comfortable clothing.  Wipe from front to back when you use the toilet.  Ask your health care provider if you can shower or take a bath. If you had an episiotomy or a perineal tear during labor and delivery, your health care provider may tell you not to take baths for a certain length of time.  Wear a bra that supports your breasts and fits you well.  If possible, have someone help you with household activities and help care for your baby for at least a few days after you leave the hospital.  Keep all follow-up visits for you and your baby as told by your health care provider. This is important. Contact a health care provider if:  You have: ?  Vaginal discharge that has a bad smell. ? Difficulty urinating. ? Pain when urinating. ? A sudden increase or decrease in the frequency of your bowel movements. ? More redness, swelling, or pain around your episiotomy or vaginal tear. ? More fluid or blood coming from your episiotomy or vaginal tear. ? Pus or a bad smell coming from your episiotomy or vaginal tear. ? A fever. ? A rash. ? Little or no interest in activities you used to enjoy. ? Questions about caring for yourself or your baby.  Your episiotomy or vaginal tear feels warm to the touch.  Your episiotomy or vaginal tear is separating or does not appear to be healing.  Your breasts are painful, hard, or turn red.  You feel unusually sad or worried.  You feel nauseous or you vomit.  You pass large blood clots from your vagina. If you pass a blood clot from your vagina, save it to show to your health care provider. Do  not flush blood clots down the toilet without having your health care provider look at them.  You urinate more than usual.  You are dizzy or light-headed.  You have not breastfed at all and you have not had a menstrual period for 12 weeks after delivery.  You have stopped breastfeeding and you have not had a menstrual period for 12 weeks after you stopped breastfeeding. Get help right away if:  You have: ? Pain that does not go away or does not get better with medicine. ? Chest pain. ? Difficulty breathing. ? Blurred vision or spots in your vision. ? Thoughts about hurting yourself or your baby.  You develop pain in your abdomen or in one of your legs.  You develop a severe headache.  You faint.  You bleed from your vagina so much that you fill two sanitary pads in one hour. This information is not intended to replace advice given to you by your health care provider. Make sure you discuss any questions you have with your health care provider. Document Released: 02/12/2000 Document Revised: 07/29/2015 Document Reviewed: 03/01/2015 Elsevier Interactive Patient Education  2018 [employer]

## 2017-08-02 ENCOUNTER — Encounter (HOSPITAL_COMMUNITY): Payer: Self-pay

## 2017-08-03 ENCOUNTER — Encounter: Payer: Self-pay | Admitting: Obstetrics & Gynecology

## 2017-08-09 ENCOUNTER — Encounter: Payer: Self-pay | Admitting: Obstetrics & Gynecology

## 2017-08-11 ENCOUNTER — Other Ambulatory Visit: Payer: Self-pay | Admitting: Advanced Practice Midwife

## 2017-08-15 ENCOUNTER — Encounter (INDEPENDENT_AMBULATORY_CARE_PROVIDER_SITE_OTHER): Payer: Self-pay

## 2017-09-01 ENCOUNTER — Other Ambulatory Visit: Payer: Self-pay | Admitting: Women's Health

## 2017-09-01 ENCOUNTER — Ambulatory Visit: Payer: Medicaid Other | Admitting: Women's Health

## 2017-09-01 ENCOUNTER — Encounter: Payer: Self-pay | Admitting: *Deleted

## 2021-01-17 ENCOUNTER — Ambulatory Visit
Admission: EM | Admit: 2021-01-17 | Discharge: 2021-01-17 | Disposition: A | Discharge status: Home / Self Care | Payer: Medicaid Other | Attending: Family Medicine | Admitting: Family Medicine

## 2021-01-17 ENCOUNTER — Other Ambulatory Visit: Payer: Self-pay

## 2021-01-17 DIAGNOSIS — N3000 Acute cystitis without hematuria: Secondary | ICD-10-CM | POA: Insufficient documentation

## 2021-01-17 DIAGNOSIS — N898 Other specified noninflammatory disorders of vagina: Secondary | ICD-10-CM | POA: Diagnosis not present

## 2021-01-17 DIAGNOSIS — N390 Urinary tract infection, site not specified: Secondary | ICD-10-CM | POA: Insufficient documentation

## 2021-01-17 LAB — POCT URINALYSIS DIP (MANUAL ENTRY)
Bilirubin, UA: NEGATIVE
Blood, UA: NEGATIVE
Glucose, UA: NEGATIVE mg/dL
Ketones, POC UA: NEGATIVE mg/dL
Nitrite, UA: NEGATIVE
Protein Ur, POC: NEGATIVE mg/dL
Spec Grav, UA: 1.025 (ref 1.010–1.025)
Urobilinogen, UA: 0.2 E.U./dL
pH, UA: 5.5 (ref 5.0–8.0)

## 2021-01-17 MED ORDER — CEPHALEXIN 500 MG PO CAPS: 500.0000 mg | ORAL_CAPSULE | Freq: Two times a day (BID) | ORAL | 0 refills | Status: AC

## 2021-01-17 NOTE — ED Triage Notes (Signed)
Patient states she is prone to UTI's. She feels the same symptoms from before. Starting with mild lower back pain and urine smell strong. She states she had a fever and vomit earlier this week.  Denies Meds

## 2021-01-17 NOTE — ED Provider Notes (Signed)
[facility]    CSN: [id number] Arrival date & time: 01/17/21  1033      History   Chief Complaint No chief complaint on file.   HPI Sandra Olson is a 30 y.o. female.   Patient presenting today with several week history of persisting mid to lower back pain, strong urine odor, 1 episode of nausea and vomiting, low-grade fever, vaginal irritation.  Denies dysuria, hematuria, vaginal discharge, new sexual partners, upper respiratory symptoms.  States she is prone to urinary tract infections and this is typically how they start.  She has not tried any over-the-counter medications thus far.   Past Medical History:  Diagnosis Date   Gestational diabetes    metformin   Sickle cell trait [facility])     Patient Active Problem List   Diagnosis Date Noted   Severe preeclampsia, third trimester 07/27/2017   Abnormal glucose tolerance test (GTT) during pregnancy, antepartum 06/30/2017   Gestational diabetes mellitus, class A2 06/14/2017   Supervision of high risk pregnancy, antepartum 05/08/2017   Late prenatal care in third trimester 05/08/2017   Depression 03/27/2017   Sickle cell trait (HCC) 01/14/2015   History of gestational hypertension 01/14/2015    Past Surgical History:  Procedure Laterality Date   CHOLECYSTECTOMY     TUBAL LIGATION Bilateral 07/28/2017   Procedure: POST PARTUM TUBAL LIGATION;  Surgeon: Conan Bowens, MD;  Location: [facility];  Service: Gynecology;  Laterality: Bilateral;    OB History     Gravida  4   Para  4   Term  4   Preterm      AB      Living  4      SAB      IAB      Ectopic      Multiple  0   Live Births  4            Home Medications    Prior to Admission medications   Medication Sig Start Date End Date Taking? Authorizing Provider  cephALEXin (KEFLEX) 500 MG capsule Take 1 capsule (500 mg total) by mouth 2 (two) times daily. 01/17/21  Yes Particia Nearing, PA-C  ferrous sulfate 325  (65 FE) MG tablet TAKE 1 TABLET (325 MG TOTAL) BY MOUTH 2 (TWO) TIMES DAILY WITH A MEAL. 09/04/17   Cheral Marker, CNM  ibuprofen (ADVIL,MOTRIN) 600 MG tablet Take 1 tablet (600 mg total) by mouth every 6 (six) hours as needed. 07/29/17   Arabella Merles, CNM  NIFEdipine (PROCARDIA-XL/ADALAT CC) 30 MG 24 hr tablet Take 1 tablet (30 mg total) by mouth daily. 07/29/17   Cam Hai D, CNM  oxyCODONE (OXY IR/ROXICODONE) 5 MG immediate release tablet Take 1 tablet (5 mg total) by mouth every 4 (four) hours as needed (pain scale 4-7). 07/29/17   Arabella Merles, CNM  prenatal vitamin w/FE, FA (NATACHEW) 29-1 MG CHEW chewable tablet Take 1 tablet po daily Patient taking differently: Chew 1 tablet by mouth daily at 12 noon. 01/16/15   Lazaro Arms, MD    Family History Family History  Problem Relation Age of Onset   Hypertension Mother    Diabetes Mother    Hypertension Father    Diabetes Father    Sickle cell trait Daughter    Sickle cell trait Son    Hypertension Paternal Grandfather    Diabetes Paternal Grandfather    Breast cancer Paternal Grandmother    Hypertension Maternal Grandmother  Diabetes Maternal Grandmother    Sickle cell anemia Maternal Grandmother    Hypertension Paternal Uncle     Social History Social History   Tobacco Use   Smoking status: Never   Smokeless tobacco: Never  Substance Use Topics   Alcohol use: No   Drug use: No     Allergies   Codeine   Review of Systems Review of Systems Per HPI  Physical Exam Triage Vital Signs ED Triage Vitals  Enc Vitals Group     BP 01/17/21 1228 123/89     Pulse Rate 01/17/21 1228 67     Resp 01/17/21 1228 16     Temp 01/17/21 1228 98.8 F (37.1 C)     Temp Source 01/17/21 1228 Oral     SpO2 01/17/21 1228 99 %     Weight --      Height --      Head Circumference --      Peak Flow --      Pain Score 01/17/21 1225 0     Pain Loc --      Pain Edu? --      Excl. in [place]? --    No data  found.  Updated Vital Signs BP 123/89 (BP Location: Right Arm)   Pulse 67   Temp 98.8 F (37.1 C) (Oral)   Resp 16   LMP 12/30/2020 (Exact Date)   SpO2 99%   Breastfeeding No   Visual Acuity Right Eye Distance:   Left Eye Distance:   Bilateral Distance:    Right Eye Near:   Left Eye Near:    Bilateral Near:     Physical Exam Vitals and nursing note reviewed.  Constitutional:      Appearance: Normal appearance. She is not ill-appearing.  HENT:     Head: Atraumatic.     Nose: Nose normal.     Mouth/Throat:     Mouth: Mucous membranes are moist.     Pharynx: Oropharynx is clear. No posterior oropharyngeal erythema.  Eyes:     Extraocular Movements: Extraocular movements intact.     Conjunctiva/sclera: Conjunctivae normal.  Cardiovascular:     Rate and Rhythm: Normal rate and regular rhythm.     Heart sounds: Normal heart sounds.  Pulmonary:     Effort: Pulmonary effort is normal.     Breath sounds: Normal breath sounds.  Abdominal:     General: Bowel sounds are normal. There is no distension.     Palpations: Abdomen is soft.     Tenderness: There is no abdominal tenderness. There is no right CVA tenderness, left CVA tenderness or guarding.  Musculoskeletal:        General: Normal range of motion.     Cervical back: Normal range of motion and neck supple.  Skin:    General: Skin is warm and dry.  Neurological:     Mental Status: She is alert and oriented to person, place, and time.  Psychiatric:        Mood and Affect: Mood normal.        Thought Content: Thought content normal.        Judgment: Judgment normal.     UC Treatments / Results  Labs (all labs ordered are listed, but only abnormal results are displayed) Labs Reviewed  POCT URINALYSIS DIP (MANUAL ENTRY) - Abnormal; Notable for the following components:      Result Value   Leukocytes, UA Large (3+) (*)    All other components within  normal limits  URINE CULTURE  CERVICOVAGINAL ANCILLARY ONLY     EKG   Radiology No results found.  Procedures Procedures (including critical care time)  Medications Ordered in UC Medications - No data to display  Initial Impression / Assessment and Plan / UC Course  I have reviewed the triage vital signs and the nursing notes.  Pertinent labs & imaging results that were available during my care of the patient were reviewed by me and considered in my medical decision making (see chart for details).     Vitals and exam overall benign and reassuring, urinalysis with 3+ leuks so urine culture pending as well as vaginal swab to be thorough.  Treat with Keflex, push fluids, over-the-counter pain relievers as needed.  Return for acutely worsening symptoms.  Final Clinical Impressions(s) / UC Diagnoses   Final diagnoses:  Acute lower UTI  Vaginal irritation   Discharge Instructions   None    ED Prescriptions     Medication Sig Dispense Auth. Provider   cephALEXin (KEFLEX) 500 MG capsule Take 1 capsule (500 mg total) by mouth 2 (two) times daily. 10 capsule Volney American, [place]      PDMP not reviewed this encounter.   Volney American, [place] 01/17/21 1339

## 2021-01-18 LAB — CERVICOVAGINAL ANCILLARY ONLY
Bacterial Vaginitis (gardnerella): POSITIVE — AB
Candida Glabrata: NEGATIVE
Candida Vaginitis: NEGATIVE
Chlamydia: NEGATIVE
Comment: NEGATIVE
Comment: NEGATIVE
Comment: NEGATIVE
Comment: NEGATIVE
Comment: NEGATIVE
Comment: NORMAL
Neisseria Gonorrhea: NEGATIVE
Trichomonas: NEGATIVE

## 2021-01-18 LAB — URINE CULTURE: Culture: <10000 — AB

## 2021-01-19 ENCOUNTER — Telehealth (HOSPITAL_COMMUNITY): Payer: Self-pay | Admitting: Emergency Medicine

## 2021-01-19 MED ORDER — METRONIDAZOLE 500 MG PO TABS: 500.0000 mg | ORAL_TABLET | Freq: Two times a day (BID) | ORAL | 0 refills | Status: AC

## 2021-10-15 DIAGNOSIS — F322 Major depressive disorder, single episode, severe without psychotic features: Secondary | ICD-10-CM | POA: Diagnosis not present

## 2021-10-18 DIAGNOSIS — F329 Major depressive disorder, single episode, unspecified: Secondary | ICD-10-CM | POA: Diagnosis not present
